# Patient Record
Sex: Female | Born: 1980 | Race: Black or African American | Hispanic: No | Marital: Single | Smoking: Never smoker
Health system: Southern US, Community
[De-identification: ages and names within clinical notes are randomized; demographics above are authoritative.]

## PROBLEM LIST (undated history)

## (undated) DIAGNOSIS — F419 Anxiety disorder, unspecified: Secondary | ICD-10-CM

## (undated) DIAGNOSIS — E119 Type 2 diabetes mellitus without complications: Secondary | ICD-10-CM

## (undated) DIAGNOSIS — D219 Benign neoplasm of connective and other soft tissue, unspecified: Secondary | ICD-10-CM

## (undated) DIAGNOSIS — U071 COVID-19: Secondary | ICD-10-CM

## (undated) DIAGNOSIS — Z973 Presence of spectacles and contact lenses: Secondary | ICD-10-CM

## (undated) DIAGNOSIS — I10 Essential (primary) hypertension: Secondary | ICD-10-CM

## (undated) HISTORY — PX: CHOLECYSTECTOMY: SHX55

---

## 2012-08-25 DIAGNOSIS — E119 Type 2 diabetes mellitus without complications: Secondary | ICD-10-CM | POA: Insufficient documentation

## 2013-04-01 HISTORY — PX: CHOLECYSTECTOMY: SHX55

## 2014-12-27 DIAGNOSIS — E1165 Type 2 diabetes mellitus with hyperglycemia: Secondary | ICD-10-CM | POA: Insufficient documentation

## 2015-06-12 ENCOUNTER — Ambulatory Visit
Admission: EM | Admit: 2015-06-12 | Discharge: 2015-06-12 | Disposition: A | Discharge status: Home / Self Care | Payer: Medicaid Other | Attending: Family Medicine | Admitting: Family Medicine

## 2015-06-12 DIAGNOSIS — H1012 Acute atopic conjunctivitis, left eye: Secondary | ICD-10-CM | POA: Diagnosis not present

## 2015-06-12 HISTORY — DX: Type 2 diabetes mellitus without complications: E11.9

## 2015-06-12 NOTE — ED Provider Notes (Signed)
CSN: [id number]     Arrival date & time 06/12/15  1801 History   First MD Initiated Contact with Patient 06/12/15 1847     Chief Complaint  Patient presents with  . Eye Drainage   (Consider location/radiation/quality/duration/timing/severity/associated sxs/prior Treatment) HPI Comments: 35 yo female with a 1 day h/o watery and itchy left eye. Denies any pain, purulent drainage, eye injury, swelling, fevers.   The history is provided by the patient.    Past Medical History  Diagnosis Date  . Diabetes mellitus without complication [facility])    Past Surgical History  Procedure Laterality Date  . Cholecystectomy     Family History  Problem Relation Age of Onset  . Diabetes Father    Social History  Substance Use Topics  . Smoking status: Never Smoker   . Smokeless tobacco: None  . Alcohol Use: No   OB History    No data available     Review of Systems  Allergies  Review of patient's allergies indicates no known allergies.  Home Medications   Prior to Admission medications   Medication Sig Start Date End Date Taking? Authorizing Provider  Insulin Aspart (NOVOLOG FLEXPEN [place]) Inject 20 Units into the skin.   Yes Historical Provider, MD  insulin glargine (LANTUS) 100 UNIT/ML injection Inject 50 Units into the skin at bedtime.   Yes Historical Provider, MD  metFORMIN (GLUCOPHAGE) 500 MG tablet Take 500 mg by mouth 2 (two) times daily with a meal.   Yes Historical Provider, MD   Meds Ordered and Administered this Visit  Medications - No data to display  BP 144/94 mmHg  Pulse 100  Temp(Src) 97.2 F (36.2 C) (Tympanic)  Resp 16  Ht 5\' 8"  (1.727 m)  Wt 260 lb (117.935 kg)  BMI 39.54 kg/m2  SpO2 100%  LMP 06/09/2015 No data found.   Physical Exam  Constitutional: She appears well-developed and well-nourished. No distress.  Eyes: EOM and lids are normal. Pupils are equal, round, and reactive to light. Right eye exhibits no discharge. Left eye exhibits no discharge. Left  conjunctiva is injected (mildly; watering). No scleral icterus.  Skin: She is not diaphoretic.  Nursing note and vitals reviewed.   ED Course  Procedures (including critical care time)  Labs Review Labs Reviewed - No data to display  Imaging Review No results found.   Visual Acuity Review  Right Eye Distance: 20/20 corrected Left Eye Distance: 20/40 corrected Bilateral Distance:    Right Eye Near:   Left Eye Near:    Bilateral Near:         MDM   1. Allergic conjunctivitis, left    1. diagnosis reviewed with patient 2. Recommend supportive treatment with otc antihistamine eye drops and oral med 3. Follow-up prn if symptoms worsen or don't improve    Norval Gable, MD 06/12/15 1920

## 2015-06-12 NOTE — ED Notes (Signed)
Started last night with left eye watering and itching. This morning left eye crusted shut

## 2015-07-05 ENCOUNTER — Encounter: Payer: Self-pay | Admitting: Gynecology

## 2015-07-05 ENCOUNTER — Ambulatory Visit
Admission: EM | Admit: 2015-07-05 | Discharge: 2015-07-05 | Disposition: A | Discharge status: Home / Self Care | Payer: Medicaid Other | Attending: Family Medicine | Admitting: Family Medicine

## 2015-07-05 DIAGNOSIS — R5383 Other fatigue: Secondary | ICD-10-CM | POA: Diagnosis not present

## 2015-07-05 DIAGNOSIS — R52 Pain, unspecified: Secondary | ICD-10-CM | POA: Insufficient documentation

## 2015-07-05 DIAGNOSIS — R509 Fever, unspecified: Secondary | ICD-10-CM | POA: Insufficient documentation

## 2015-07-05 DIAGNOSIS — I10 Essential (primary) hypertension: Secondary | ICD-10-CM | POA: Diagnosis not present

## 2015-07-05 DIAGNOSIS — J069 Acute upper respiratory infection, unspecified: Secondary | ICD-10-CM | POA: Insufficient documentation

## 2015-07-05 DIAGNOSIS — Z9049 Acquired absence of other specified parts of digestive tract: Secondary | ICD-10-CM | POA: Diagnosis not present

## 2015-07-05 DIAGNOSIS — E119 Type 2 diabetes mellitus without complications: Secondary | ICD-10-CM | POA: Insufficient documentation

## 2015-07-05 DIAGNOSIS — Z79899 Other long term (current) drug therapy: Secondary | ICD-10-CM | POA: Diagnosis not present

## 2015-07-05 DIAGNOSIS — Z794 Long term (current) use of insulin: Secondary | ICD-10-CM | POA: Insufficient documentation

## 2015-07-05 HISTORY — DX: Essential (primary) hypertension: I10

## 2015-07-05 LAB — RAPID INFLUENZA A&B ANTIGENS
Influenza A (ARMC): NEGATIVE
Influenza B (ARMC): NEGATIVE

## 2015-07-05 NOTE — ED Notes (Signed)
Patient c/o body aches / tired x yesterday.

## 2015-07-05 NOTE — ED Provider Notes (Signed)
CSN: [id number]     Arrival date & time 07/05/15  1957 History   First MD Initiated Contact with Patient 07/05/15 2106     Chief Complaint  Patient presents with  . Generalized Body Aches   (Consider location/radiation/quality/duration/timing/severity/associated sxs/prior Treatment) Patient is a 35 y.o. female presenting with URI. The history is provided by the patient.  URI Presenting symptoms: congestion, fatigue and fever   Severity:  Moderate Onset quality:  Sudden Duration:  2 days Timing:  Constant Progression:  Worsening Chronicity:  New Relieved by:  None tried Worsened by:  Nothing tried Ineffective treatments:  None tried Associated symptoms: no headaches, no sinus pain and no wheezing   Risk factors: sick contacts   Risk factors: not elderly, no chronic cardiac disease, no chronic kidney disease, no chronic respiratory disease, no diabetes mellitus, no immunosuppression, no recent illness and no recent travel     Past Medical History  Diagnosis Date  . Diabetes mellitus without complication ([place])   . Hypertension    Past Surgical History  Procedure Laterality Date  . Cholecystectomy     Family History  Problem Relation Age of Onset  . Diabetes Father    Social History  Substance Use Topics  . Smoking status: Never Smoker   . Smokeless tobacco: None  . Alcohol Use: No   OB History    No data available     Review of Systems  Constitutional: Positive for fever and fatigue.  HENT: Positive for congestion.   Respiratory: Negative for wheezing.   Neurological: Negative for headaches.    Allergies  Review of patient's allergies indicates no known allergies.  Home Medications   Prior to Admission medications   Medication Sig Start Date End Date Taking? Authorizing Provider  ferrous sulfate 325 (65 FE) MG tablet Take 325 mg by mouth daily with breakfast.   Yes Historical Provider, MD  hydrochlorothiazide (HYDRODIURIL) 25 MG tablet Take 25 mg by mouth  daily.   Yes Historical Provider, MD  Insulin Aspart (NOVOLOG FLEXPEN ) Inject 20 Units into the skin.   Yes Historical Provider, MD  insulin glargine (LANTUS) 100 UNIT/ML injection Inject 50 Units into the skin at bedtime.   Yes Historical Provider, MD  metFORMIN (GLUCOPHAGE) 500 MG tablet Take 500 mg by mouth 2 (two) times daily with a meal.   Yes Historical Provider, MD   Meds Ordered and Administered this Visit  Medications - No data to display  BP 132/75 mmHg  Pulse 103  Temp(Src) 97.6 F (36.4 C) (Oral)  Resp 18  Ht 5\' 8"  (1.727 m)  Wt 265 lb (120.203 kg)  BMI 40.30 kg/m2  SpO2 100%  LMP 06/09/2015 No data found.   Physical Exam  Constitutional: She appears well-developed and well-nourished. No distress.  HENT:  Head: Normocephalic and atraumatic.  Right Ear: Tympanic membrane, external ear and ear canal normal.  Left Ear: Tympanic membrane, external ear and ear canal normal.  Nose: Rhinorrhea present. No nose lacerations, sinus tenderness, nasal deformity, septal deviation or nasal septal hematoma. No epistaxis.  No foreign bodies.  Mouth/Throat: Uvula is midline, oropharynx is clear and moist and mucous membranes are normal. No oropharyngeal exudate.  Eyes: Conjunctivae and EOM are normal. Pupils are equal, round, and reactive to light. Right eye exhibits no discharge. Left eye exhibits no discharge. No scleral icterus.  Neck: Normal range of motion. Neck supple. No thyromegaly present.  Cardiovascular: Normal rate, regular rhythm and normal heart sounds.   Pulmonary/Chest: Effort normal  and breath sounds normal. No respiratory distress. She has no wheezes. She has no rales.  Lymphadenopathy:    She has no cervical adenopathy.  Skin: She is not diaphoretic.  Nursing note and vitals reviewed.   ED Course  Procedures (including critical care time)  Labs Review Labs Reviewed  RAPID INFLUENZA A&B ANTIGENS ([place])    Imaging Review No results  found.   Visual Acuity Review  Right Eye Distance:   Left Eye Distance:   Bilateral Distance:    Right Eye Near:   Left Eye Near:    Bilateral Near:         MDM   1. Viral URI    1. Lab results and diagnosis reviewed with patient 2. Recommend supportive treatment with otc analgesics prn, increased fluids 3. Follow-up prn if symptoms worsen or don't improve    Norval Gable, MD 07/05/15 2150

## 2015-08-06 ENCOUNTER — Ambulatory Visit
Admission: EM | Admit: 2015-08-06 | Discharge: 2015-08-06 | Disposition: A | Discharge status: Home / Self Care | Payer: Medicaid Other | Attending: Family Medicine | Admitting: Family Medicine

## 2015-08-06 DIAGNOSIS — B349 Viral infection, unspecified: Secondary | ICD-10-CM

## 2015-08-06 MED ORDER — FLUTICASONE PROPIONATE 50 MCG/ACT NA SUSP: 2.0000 | Freq: Every day | NASAL | Status: DC

## 2015-08-06 NOTE — ED Provider Notes (Signed)
CSN: [id number]     Arrival date & time 08/06/15  1450 History   First MD Initiated Contact with Patient 08/06/15 1615     Chief Complaint  Patient presents with  . Nasal Congestion    Runny nose, congestion, body aches starting on Friday. Pain 7/10   (Consider location/radiation/quality/duration/timing/severity/associated sxs/prior Treatment) HPI   So 35 year old female who presents with congestion runny nose which began days ago. Since she is so weak at times that that her husband has to help her up out of a chair or the bed. She has been coughing with some brown sputum but is not coughing much at nighttime. Her urine is very concentrated data set very dark yellow according to her. She has not been drinking a lot of fluid because it hurts to swallow. Review of her previous visits show that she was here on April and was tested for influenza which was negative.  Past Medical History  Diagnosis Date  . Diabetes mellitus without complication ([place])   . Hypertension    Past Surgical History  Procedure Laterality Date  . Cholecystectomy     Family History  Problem Relation Age of Onset  . Diabetes Father    Social History  Substance Use Topics  . Smoking status: Never Smoker   . Smokeless tobacco: None  . Alcohol Use: No   OB History    No data available     Review of Systems  Constitutional: Positive for activity change, appetite change and fatigue. Negative for chills.  HENT: Positive for postnasal drip, rhinorrhea, sinus pressure, sneezing and sore throat.   Respiratory: Positive for cough. Negative for shortness of breath.   All other systems reviewed and are negative.   Allergies  Review of patient's allergies indicates no known allergies.  Home Medications   Prior to Admission medications   Medication Sig Start Date End Date Taking? Authorizing Provider  ferrous sulfate 325 (65 FE) MG tablet Take 325 mg by mouth daily with breakfast.   Yes Historical Provider, MD   hydrochlorothiazide (HYDRODIURIL) 25 MG tablet Take 25 mg by mouth daily.   Yes Historical Provider, MD  Insulin Aspart (NOVOLOG FLEXPEN [place]) Inject 20 Units into the skin.   Yes Historical Provider, MD  insulin glargine (LANTUS) 100 UNIT/ML injection Inject 50 Units into the skin at bedtime.   Yes Historical Provider, MD  metFORMIN (GLUCOPHAGE) 500 MG tablet Take 500 mg by mouth 2 (two) times daily with a meal.   Yes Historical Provider, MD  fluticasone (FLONASE) 50 MCG/ACT nasal spray Place 2 sprays into both nostrils daily. 08/06/15   Lorin Picket, PA-C   Meds Ordered and Administered this Visit  Medications - No data to display  BP 133/90 mmHg  Pulse 98  Temp(Src) 98.7 F (37.1 C) (Oral)  Resp 20  Ht 5\' 8"  (1.727 m)  Wt 270 lb (122.471 kg)  BMI 41.06 kg/m2  SpO2 100%  LMP 07/31/2015 No data found.   Physical Exam  Constitutional: She is oriented to person, place, and time. She appears well-developed and well-nourished. No distress.  HENT:  Head: Normocephalic and atraumatic.  Right Ear: External ear normal.  Left Ear: External ear normal.  Nose: Nose normal.  Mouth/Throat: Oropharynx is clear and moist. No oropharyngeal exudate.  No tenderness to percussion over the sinuses  Eyes: Conjunctivae are normal. Pupils are equal, round, and reactive to light.  Neck: Normal range of motion. Neck supple.  Pulmonary/Chest: Effort normal and breath sounds normal. No  respiratory distress. She has no wheezes. She has no rales.  Musculoskeletal: Normal range of motion. She exhibits no edema or tenderness.  Lymphadenopathy:    She has no cervical adenopathy.  Neurological: She is alert and oriented to person, place, and time.  Skin: Skin is warm and dry. She is not diaphoretic.  Psychiatric: She has a normal mood and affect. Her behavior is normal. Judgment and thought content normal.  Nursing note and vitals reviewed.   ED Course  Procedures (including critical care time)  Labs  Review Labs Reviewed - No data to display  Imaging Review No results found.   Visual Acuity Review  Right Eye Distance:   Left Eye Distance:   Bilateral Distance:    Right Eye Near:   Left Eye Near:    Bilateral Near:         MDM   1. Acute viral syndrome    New Prescriptions   FLUTICASONE (FLONASE) 50 MCG/ACT NASAL SPRAY    Place 2 sprays into both nostrils daily.  Plan: 1. Test/x-ray results and diagnosis reviewed with patient 2. rx as per orders; risks, benefits, potential side effects reviewed with patient 3. Recommend supportive treatment with Tylenol or Motrin around-the-clock. Rest and increased fluids to have urine clear one fevers or is not improving or worsening she should be seen by her primary care physician. Given her excuse for school tomorrow but should return to classes on Tuesday. 4. F/u prn if symptoms worsen or don't improve     Lorin Picket, PA-C 08/06/15 1635

## 2015-08-18 ENCOUNTER — Ambulatory Visit
Admission: EM | Admit: 2015-08-18 | Discharge: 2015-08-18 | Disposition: A | Discharge status: Home / Self Care | Payer: Medicaid Other | Attending: Family Medicine | Admitting: Family Medicine

## 2015-08-18 ENCOUNTER — Encounter: Payer: Self-pay | Admitting: Emergency Medicine

## 2015-08-18 DIAGNOSIS — K0889 Other specified disorders of teeth and supporting structures: Secondary | ICD-10-CM | POA: Diagnosis not present

## 2015-08-18 MED ORDER — PENICILLIN V POTASSIUM 500 MG PO TABS: 500.0000 mg | ORAL_TABLET | Freq: Four times a day (QID) | ORAL | Status: DC

## 2015-08-18 MED ORDER — OXYCODONE-ACETAMINOPHEN 5-325 MG PO TABS: 1.0000 | ORAL_TABLET | Freq: Three times a day (TID) | ORAL | Status: DC | PRN

## 2015-08-18 NOTE — ED Provider Notes (Signed)
[facility]  Time seen: Approximately 3:20 PM  I have reviewed the triage vital signs and the nursing notes.   HISTORY  Chief Complaint Dental Pain    HPI Joan Schmidt is a 35 y.o. female presents for the complaints of right lower dental pain since yesterday. Reports history of broken tooth to that same area for a "long time" but states last night pain started to the tooth. States area feels swollen. Denies drainage. Denies fevers. Denies trauma. Reports continues to eat and drink well. Denies other pain. Denies pain radiation. Denies recent sickness. States she knows she has been needing to see her dentist but has been putting it off. States she will now follow up, but her dentist was closed today.   PCP: [facility]   Patient's last menstrual period was 08/01/2015 (approximate).Denies chance of pregnancy.    Past Medical History  Diagnosis Date  . Diabetes mellitus without complication ([place])   . Hypertension   blood sugar this am 140 per patient  There are no active problems to display for this patient.   Past Surgical History  Procedure Laterality Date  . Cholecystectomy      Current Outpatient Rx  Name  Route  Sig  Dispense  Refill  . ferrous sulfate 325 (65 FE) MG tablet   Oral   Take 325 mg by mouth daily with breakfast.         . fluticasone (FLONASE) 50 MCG/ACT nasal spray   Each Nare   Place 2 sprays into both nostrils daily.   16 g   0   . hydrochlorothiazide (HYDRODIURIL) 25 MG tablet   Oral   Take 25 mg by mouth daily.         . Insulin Aspart (NOVOLOG FLEXPEN [place])   Subcutaneous   Inject 20 Units into the skin.         Marland Kitchen insulin glargine (LANTUS) 100 UNIT/ML injection   Subcutaneous   Inject 50 Units into the skin at bedtime.         . metFORMIN (GLUCOPHAGE) 500 MG tablet   Oral   Take 500 mg by mouth 2 (two) times daily with a meal.           Allergies Review of patient's allergies indicates no known  allergies.  Family History  Problem Relation Age of Onset  . Diabetes Father     Social History Social History  Substance Use Topics  . Smoking status: Never Smoker   . Smokeless tobacco: None  . Alcohol Use: No    Review of Systems Constitutional: No fever/chills. Reports continues to eat and drink foods and fluids well.  Eyes: No visual changes. ENT: No sore throat. Cardiovascular: Denies chest pain. Respiratory: Denies shortness of breath. Gastrointestinal: No abdominal pain.  No nausea, no vomiting.  Genitourinary: Negative for dysuria. Musculoskeletal: Negative for back pain. Skin: Negative for rash. Neurological: Negative for headaches, focal weakness or numbness. 10-point ROS otherwise negative.  ____________________________________________   PHYSICAL EXAM:  VITAL SIGNS: ED Triage Vitals  Enc Vitals Group     BP 08/18/15 1434 151/74 mmHg     Pulse Rate 08/18/15 1434 91     Resp 08/18/15 1434 16     Temp 08/18/15 1434 98 F (36.7 C)     Temp Source 08/18/15 1434 Oral     SpO2 08/18/15 1434 100 %     Weight 08/18/15 1434 270 lb (122.471 kg)     Height 08/18/15 1434 5\' 8"  (  1.727 m)     Head Cir --      Peak Flow --      Pain Score 08/18/15 1436 5     Pain Loc --      Pain Edu? --      Excl. in [place]? --     Constitutional: Alert and oriented. Well appearing and in no acute distress. Eyes: Conjunctivae are normal. PERRL. EOMI. Head: Atraumatic. Ears: Bilateral ears no erythema, normal TMs.  Nose: No congestion/rhinnorhea. Mouth/Throat: Mucous membranes are moist.  Oropharynx non-erythematous. Periodontal Exam   Fracture to tooth #30 as above. Mild gum erythema directly around tooth #30 and 29. Widespread dental decay , no palpable or visualized abscess.  Neck: No stridor.  Hematological/Lymphatic/Immunilogical: No cervical lymphadenopathy. Cardiovascular:   Normal rate, regular rhythm. Grossly normal heart sounds. Good peripheral  circulation. Respiratory: Normal respiratory effort.  No retractions. Musculoskeletal: No lower or upper extremity tenderness nor edema.  No joint effusions. Neurologic:  Normal speech and language. No gross focal neurologic deficits are appreciated. Speech is normal. No gait instability. Skin:  Skin is warm, dry and intact. No rash noted. Psychiatric: Mood and affect are normal. Speech and behavior are normal.  ____________________________________________   LABS (all labs ordered are listed, but only abnormal results are displayed)  Labs Reviewed - No data to display ____________________________________________ _________________________________________   INITIAL IMPRESSION / ASSESSMENT AND PLAN / ED COURSE  Pertinent labs & imaging results that were available during my care of the patient were reviewed by me and considered in my medical decision making (see chart for details).  Well-appearing no acute distress. Presents with complaints of right lower dental pain 1 day. History of mini dental cavities, dental fractures as well as uncontrolled diabetes. Reports follows with her primary care and monitoring her diabetes as prescribed. No palpable or visualized dental abscess. Will treat right lower dental pain and suspected infection with oral Pen-Vee K and when necessary Percocet. Encouraged patient to follow-up closely with her dentist and PCP.  Patient was advised to see the dentist within 10 days. Also advised to take the antibiotic until finished. Instructed to return to the urgent care or ER for symptoms that change or worsen or if unable to schedule an appointment. ____________________________________________   FINAL CLINICAL IMPRESSION(S) / ED DIAGNOSES  Final diagnoses:  Pain, dental       Marylene Land, NP 08/18/15 1549  Marylene Land, NP 08/18/15 1551

## 2015-08-18 NOTE — ED Notes (Signed)
Patient states that she broke her tooth on left bottom side since yesterday.

## 2015-08-18 NOTE — Discharge Instructions (Signed)
Take medication as prescribed. Rest. Drink plenty of fluids. Eat soft food diet. Follow up with your dentist as soon as possible.   Follow up with your primary care physician this week as needed. Return to Urgent care for new or worsening concerns.

## 2015-11-17 ENCOUNTER — Encounter: Payer: Self-pay | Admitting: Emergency Medicine

## 2015-11-17 ENCOUNTER — Emergency Department: Payer: Medicaid Other

## 2015-11-17 ENCOUNTER — Emergency Department
Admission: EM | Admit: 2015-11-17 | Discharge: 2015-11-17 | Disposition: A | Discharge status: Home / Self Care | Payer: Medicaid Other | Attending: Emergency Medicine | Admitting: Emergency Medicine

## 2015-11-17 DIAGNOSIS — I10 Essential (primary) hypertension: Secondary | ICD-10-CM | POA: Diagnosis not present

## 2015-11-17 DIAGNOSIS — D259 Leiomyoma of uterus, unspecified: Secondary | ICD-10-CM | POA: Diagnosis not present

## 2015-11-17 DIAGNOSIS — R1031 Right lower quadrant pain: Secondary | ICD-10-CM | POA: Diagnosis present

## 2015-11-17 DIAGNOSIS — Z792 Long term (current) use of antibiotics: Secondary | ICD-10-CM | POA: Diagnosis not present

## 2015-11-17 DIAGNOSIS — E119 Type 2 diabetes mellitus without complications: Secondary | ICD-10-CM | POA: Diagnosis not present

## 2015-11-17 DIAGNOSIS — Z794 Long term (current) use of insulin: Secondary | ICD-10-CM | POA: Insufficient documentation

## 2015-11-17 DIAGNOSIS — Z7984 Long term (current) use of oral hypoglycemic drugs: Secondary | ICD-10-CM | POA: Diagnosis not present

## 2015-11-17 DIAGNOSIS — Z7951 Long term (current) use of inhaled steroids: Secondary | ICD-10-CM | POA: Diagnosis not present

## 2015-11-17 LAB — URINALYSIS COMPLETE WITH MICROSCOPIC (ARMC ONLY)
Bilirubin Urine: NEGATIVE
Glucose, UA: NEGATIVE mg/dL
Leukocytes, UA: NEGATIVE
Nitrite: NEGATIVE
PROTEIN: NEGATIVE mg/dL
Specific Gravity, Urine: 1.012 (ref 1.005–1.030)
pH: 6 (ref 5.0–8.0)

## 2015-11-17 LAB — CBC
HCT: 38.1 % (ref 35.0–47.0)
HEMOGLOBIN: 11.9 g/dL — AB (ref 12.0–16.0)
MCH: 20.5 pg — AB (ref 26.0–34.0)
MCHC: 31.2 g/dL — ABNORMAL LOW (ref 32.0–36.0)
MCV: 65.6 fL — AB (ref 80.0–100.0)
Platelets: 214 10*3/uL (ref 150–440)
RBC: 5.8 MIL/uL — AB (ref 3.80–5.20)
RDW: 18.9 % — ABNORMAL HIGH (ref 11.5–14.5)
WBC: 6.3 10*3/uL (ref 3.6–11.0)

## 2015-11-17 LAB — COMPREHENSIVE METABOLIC PANEL
ALBUMIN: 4.1 g/dL (ref 3.5–5.0)
ALT: 45 U/L (ref 14–54)
ANION GAP: 6 (ref 5–15)
AST: 38 U/L (ref 15–41)
Alkaline Phosphatase: 53 U/L (ref 38–126)
BUN: 9 mg/dL (ref 6–20)
CHLORIDE: 108 mmol/L (ref 101–111)
CO2: 25 mmol/L (ref 22–32)
Calcium: 8.9 mg/dL (ref 8.9–10.3)
Creatinine, Ser: 0.68 mg/dL (ref 0.44–1.00)
GFR calc non Af Amer: >60 mL/min (ref >60)
GLUCOSE: 104 mg/dL — AB (ref 65–99)
Potassium: 4.2 mmol/L (ref 3.5–5.1)
SODIUM: 139 mmol/L (ref 135–145)
Total Bilirubin: 0.4 mg/dL (ref 0.3–1.2)
Total Protein: 7.7 g/dL (ref 6.5–8.1)

## 2015-11-17 LAB — POCT PREGNANCY, URINE: PREG TEST UR: NEGATIVE

## 2015-11-17 LAB — LIPASE, BLOOD: LIPASE: 16 U/L (ref 11–51)

## 2015-11-17 MED ORDER — OXYCODONE-ACETAMINOPHEN 5-325 MG PO TABS: 1.0000 | ORAL_TABLET | ORAL | 0 refills | Status: DC | PRN

## 2015-11-17 MED ORDER — OXYCODONE-ACETAMINOPHEN 5-325 MG PO TABS
2.0000 | ORAL_TABLET | Freq: Once | ORAL | Status: AC
  Administered 2015-11-17: 2 via ORAL
  Filled 2015-11-17: qty 2

## 2015-11-17 MED ORDER — ONDANSETRON 4 MG PO TBDP: ORAL_TABLET | ORAL | 0 refills | Status: DC

## 2015-11-17 MED ORDER — DOCUSATE SODIUM 100 MG PO CAPS: ORAL_CAPSULE | ORAL | 0 refills | Status: AC

## 2015-11-17 NOTE — ED Provider Notes (Signed)
[facility] Emergency Department Provider Note  ____________________________________________   First MD Initiated Contact with Patient 11/17/15 1607     (approximate)  I have reviewed the triage vital signs and the nursing notes.   HISTORY  Chief Complaint Abdominal Pain    HPI Joan Schmidt is a 35 y.o. female whose medical history includes fibroids and regular menses who presents for evaluation of gradual onset right lower quadrant pain starting this morning.  She states that she awoke and had the pain in her right lower quadrant.  It is mild to moderate but she became concerned because her husband told her it may be appendicitis.  It improved with Tylenol but did not completely go away.  It is currently mild.  Moving around makes it worse.  She denies fever/chills, chest pain, shortness of breath, nausea, vomiting, diarrhea, constipation, dysuria.  Her last menstrual cycle was 3 weeks ago and she has started spotting.  She has never had problems with ovarian cysts before but she has had bleeding issues and pain that she believes to be associated with her fibroid.  He sees a GYN doctor at [facility] for this issue.  She denies any recent vaginal discharge.  Past Medical History:  Diagnosis Date  . Diabetes mellitus without complication ([place])   . Hypertension     There are no active problems to display for this patient.   Past Surgical History:  Procedure Laterality Date  . CHOLECYSTECTOMY      Prior to Admission medications   Medication Sig Start Date End Date Taking? Authorizing Provider  docusate sodium (COLACE) 100 MG capsule Take 1 tablet once or twice daily as needed for constipation while taking narcotic pain medicine 11/17/15   Hinda Kehr, MD  ferrous sulfate 325 (65 FE) MG tablet Take 325 mg by mouth daily with breakfast.    Historical Provider, MD  fluticasone (FLONASE) 50 MCG/ACT nasal spray Place 2 sprays into both nostrils daily. 08/06/15    Lorin Picket, PA-C  hydrochlorothiazide (HYDRODIURIL) 25 MG tablet Take 25 mg by mouth daily.    Historical Provider, MD  Insulin Aspart (NOVOLOG FLEXPEN [place]) Inject 20 Units into the skin.    Historical Provider, MD  insulin glargine (LANTUS) 100 UNIT/ML injection Inject 50 Units into the skin at bedtime.    Historical Provider, MD  metFORMIN (GLUCOPHAGE) 500 MG tablet Take 500 mg by mouth 2 (two) times daily with a meal.    Historical Provider, MD  ondansetron (ZOFRAN ODT) 4 MG disintegrating tablet Allow 1-2 tablets to dissolve in your mouth every 8 hours as needed for nausea/vomiting 11/17/15   Hinda Kehr, MD  oxyCODONE-acetaminophen (ROXICET) 5-325 MG tablet Take 1-2 tablets by mouth every 4 (four) hours as needed for severe pain. 11/17/15   Hinda Kehr, MD  penicillin v potassium (VEETID) 500 MG tablet Take 1 tablet (500 mg total) by mouth 4 (four) times daily. 08/18/15   Marylene Land, NP    Allergies Review of patient's allergies indicates no known allergies.  Family History  Problem Relation Age of Onset  . Diabetes Father     Social History Social History  Substance Use Topics  . Smoking status: Never Smoker  . Smokeless tobacco: Never Used  . Alcohol use No    Review of Systems Constitutional: No fever/chills Eyes: No visual changes. ENT: No sore throat. Cardiovascular: Denies chest pain. Respiratory: Denies shortness of breath. Gastrointestinal: +abdominal pain.  No nausea, no vomiting.  No diarrhea.  No  constipation. Genitourinary: Negative for dysuria. Recent vaginal spotting Musculoskeletal: Negative for back pain. Skin: Negative for rash. Neurological: Negative for headaches, focal weakness or numbness.  10-point ROS otherwise negative.  ____________________________________________   PHYSICAL EXAM:  VITAL SIGNS: ED Triage Vitals [11/17/15 1422]  Enc Vitals Group     BP 129/76     Pulse Rate 86     Resp 20     Temp 98.5 F (36.9 C)     Temp  Source Oral     SpO2 99 %     Weight 270 lb (122.5 kg)     Height 5\' 8"  (1.727 m)     Head Circumference      Peak Flow      Pain Score 8     Pain Loc      Pain Edu?      Excl. in [place]?     Constitutional: Alert and oriented. Well appearing and in no acute distress. Eyes: Conjunctivae are normal. PERRL. EOMI. Head: Atraumatic. Nose: No congestion/rhinnorhea. Mouth/Throat: Mucous membranes are moist.  Oropharynx non-erythematous. Neck: No stridor.  No meningeal signs.   Cardiovascular: Normal rate, regular rhythm. Good peripheral circulation. Grossly normal heart sounds.  Respiratory: Normal respiratory effort.  No retractions. Lungs CTAB. Gastrointestinal: Obese.  Soft with mild tenderness to palpation of her suprapubic region and right lower quadrant.  No rebound and no guarding. Genitourinary: Deferred at patient's preference Musculoskeletal: No lower extremity tenderness nor edema. No gross deformities of extremities. Neurologic:  Normal speech and language. No gross focal neurologic deficits are appreciated.  Skin:  Skin is warm, dry and intact. No rash noted. Psychiatric: Mood and affect are normal. Speech and behavior are normal.  ____________________________________________   LABS (all labs ordered are listed, but only abnormal results are displayed)  Labs Reviewed  COMPREHENSIVE METABOLIC PANEL - Abnormal; Notable for the following:       Result Value   Glucose, Bld 104 (*)    All other components within normal limits  CBC - Abnormal; Notable for the following:    RBC 5.80 (*)    Hemoglobin 11.9 (*)    MCV 65.6 (*)    MCH 20.5 (*)    MCHC 31.2 (*)    RDW 18.9 (*)    All other components within normal limits  URINALYSIS COMPLETEWITH MICROSCOPIC ([facility] ONLY) - Abnormal; Notable for the following:    Color, Urine YELLOW (*)    APPearance CLEAR (*)    Ketones, ur 1+ (*)    Hgb urine dipstick 3+ (*)    Bacteria, UA RARE (*)    Squamous Epithelial / LPF 0-5 (*)     All other components within normal limits  LIPASE, BLOOD  POC URINE PREG, ED  POCT PREGNANCY, URINE   ____________________________________________  EKG  None ____________________________________________  RADIOLOGY   US Transvaginal Non-ob  Result Date: 11/17/2015 CLINICAL DATA:  Right lower quadrant/pelvic pain, known uterine fibroids EXAM: TRANSABDOMINAL AND TRANSVAGINAL ULTRASOUND OF PELVIS TECHNIQUE: Both transabdominal and transvaginal ultrasound examinations of the pelvis were performed. Transabdominal technique was performed for global imaging of the pelvis including uterus, ovaries, adnexal regions, and pelvic cul-de-sac. It was necessary to proceed with endovaginal exam following the transabdominal exam to visualize the endometrium. COMPARISON:  None FINDINGS: Uterus Measurements: 15.1 x 9.4 x 11.3 cm. Multiple large uterine fibroids. Dominant 8.7 x 8.7 x 9.1 cm intramural fibroid in the left uterine body. Dominant 6.2 x 5.4 x 5.5 cm intramural fibroid in the left anterior  uterine fundus. Endometrium Thickness: 9 mm.  No focal abnormality visualized. Right ovary Measurements: 3.4 x 2.9 x 3.3 cm. Normal appearance/no adnexal mass. Left ovary Measurements: 3.5 x 1.9 x 3.3 cm. Normal appearance/no adnexal mass. Other findings No abnormal free fluid. IMPRESSION: Enlarged uterus with two dominant uterine fibroids measuring up to 9.1 cm in the left uterine body. Endometrial complex measures 9 mm, within normal limits. Bilateral ovaries are unremarkable. Electronically Signed   By: Julian Hy M.D.   On: 11/17/2015 17:29   US Pelvis Complete  Result Date: 11/17/2015 CLINICAL DATA:  Right lower quadrant/pelvic pain, known uterine fibroids EXAM: TRANSABDOMINAL AND TRANSVAGINAL ULTRASOUND OF PELVIS TECHNIQUE: Both transabdominal and transvaginal ultrasound examinations of the pelvis were performed. Transabdominal technique was performed for global imaging of the pelvis including uterus,  ovaries, adnexal regions, and pelvic cul-de-sac. It was necessary to proceed with endovaginal exam following the transabdominal exam to visualize the endometrium. COMPARISON:  None FINDINGS: Uterus Measurements: 15.1 x 9.4 x 11.3 cm. Multiple large uterine fibroids. Dominant 8.7 x 8.7 x 9.1 cm intramural fibroid in the left uterine body. Dominant 6.2 x 5.4 x 5.5 cm intramural fibroid in the left anterior uterine fundus. Endometrium Thickness: 9 mm.  No focal abnormality visualized. Right ovary Measurements: 3.4 x 2.9 x 3.3 cm. Normal appearance/no adnexal mass. Left ovary Measurements: 3.5 x 1.9 x 3.3 cm. Normal appearance/no adnexal mass. Other findings No abnormal free fluid. IMPRESSION: Enlarged uterus with two dominant uterine fibroids measuring up to 9.1 cm in the left uterine body. Endometrial complex measures 9 mm, within normal limits. Bilateral ovaries are unremarkable. Electronically Signed   By: Julian Hy M.D.   On: 11/17/2015 17:29    ____________________________________________   PROCEDURES  Procedure(s) performed:   Procedures   Critical Care performed: No ____________________________________________   INITIAL IMPRESSION / ASSESSMENT AND PLAN / ED COURSE  Pertinent labs & imaging results that were available during my care of the patient were reviewed by me and considered in my medical decision making (see chart for details).  Patient's physical exam is reassuring as is her lab workup.  I find early appendicitis to be possible but very low probability and I explained this to the patient.  I think that pain associated with either ovarian cyst or her fibroids is much more likely.  We discussed it including the long-term risks and benefits of CT scan, and we agreed that we would proceed with ultrasound instead.  She also prefers not to have a pelvic exam at this time which I think is appropriate given that it is very unlikely to find a diagnosis based on the pelvic  exam.   Clinical Course  Comment By Time  Patient feels no worse, still with some mild pain worse with movement.  Her ultrasound was reassuring.  I still feel that a CT scan is not indicated and she understands and agrees.  She will follow up with her OB/GYN at the next available opportunity and in fact has already scheduled an appointment.I gave my usual and customary return precautions.  Hinda Kehr, MD 08/18 1839    ____________________________________________  FINAL CLINICAL IMPRESSION(S) / ED DIAGNOSES  Final diagnoses:  Right lower quadrant abdominal pain  Uterine leiomyoma, unspecified location     MEDICATIONS GIVEN DURING THIS VISIT:  Medications  oxyCODONE-acetaminophen (PERCOCET/ROXICET) 5-325 MG per tablet 2 tablet (not administered)     NEW OUTPATIENT MEDICATIONS STARTED DURING THIS VISIT:  New Prescriptions   DOCUSATE SODIUM (COLACE) 100 MG CAPSULE  Take 1 tablet once or twice daily as needed for constipation while taking narcotic pain medicine   ONDANSETRON (ZOFRAN ODT) 4 MG DISINTEGRATING TABLET    Allow 1-2 tablets to dissolve in your mouth every 8 hours as needed for nausea/vomiting   OXYCODONE-ACETAMINOPHEN (ROXICET) 5-325 MG TABLET    Take 1-2 tablets by mouth every 4 (four) hours as needed for severe pain.      Note:  This document was prepared using Dragon voice recognition software and may include unintentional dictation errors.    Hinda Kehr, MD 11/17/15 [phone]

## 2015-11-17 NOTE — Discharge Instructions (Signed)
You have been seen in the Emergency Department (ED) for abdominal pain.  Your evaluation did not identify a clear cause of your symptoms but was generally reassuring.  The ultrasound did show that you have two dominant uterine fibroids measuring up to 9.1 cm in the left uterine body which may be contributing to your symptoms.  Please follow up as instructed above regarding today?s emergent visit and the symptoms that are bothering you.  Return to the ED if your abdominal pain worsens or fails to improve, you develop bloody vomiting, bloody diarrhea, you are unable to tolerate fluids due to vomiting, fever greater than 101, or other symptoms that concern you.

## 2015-11-17 NOTE — ED Triage Notes (Signed)
Pt to ed with c/o right lower quad abd pain that started this am.  Pt reports pain is better after taking tylenol but she wanted to get checked out "since it could be my appendix"  Pt appears in no acute distress t this time. Denies n/v/d/.

## 2015-11-17 NOTE — ED Notes (Addendum)
Pain in abdomen that started yesterday worse this morning from right lower abdomen radiating to the back. Pt c/o pain 8/10. Denies any diarrhea or vomiting. C/o dizziness at times. Pt took tylenol at 0600.

## 2015-11-20 ENCOUNTER — Encounter: Payer: Self-pay | Admitting: Emergency Medicine

## 2015-11-20 ENCOUNTER — Emergency Department
Admission: EM | Admit: 2015-11-20 | Discharge: 2015-11-20 | Disposition: A | Discharge status: Home / Self Care | Payer: Medicaid Other | Attending: Emergency Medicine | Admitting: Emergency Medicine

## 2015-11-20 DIAGNOSIS — Z794 Long term (current) use of insulin: Secondary | ICD-10-CM | POA: Diagnosis not present

## 2015-11-20 DIAGNOSIS — E119 Type 2 diabetes mellitus without complications: Secondary | ICD-10-CM | POA: Diagnosis not present

## 2015-11-20 DIAGNOSIS — I10 Essential (primary) hypertension: Secondary | ICD-10-CM | POA: Insufficient documentation

## 2015-11-20 DIAGNOSIS — Z7951 Long term (current) use of inhaled steroids: Secondary | ICD-10-CM | POA: Insufficient documentation

## 2015-11-20 DIAGNOSIS — N939 Abnormal uterine and vaginal bleeding, unspecified: Secondary | ICD-10-CM | POA: Diagnosis not present

## 2015-11-20 HISTORY — DX: Benign neoplasm of connective and other soft tissue, unspecified: D21.9

## 2015-11-20 LAB — CBC WITH DIFFERENTIAL/PLATELET
BASOS ABS: 0 10*3/uL (ref 0–0.1)
BASOS PCT: 0 %
Eosinophils Absolute: 0.2 10*3/uL (ref 0–0.7)
Eosinophils Relative: 3 %
HEMATOCRIT: 34.3 % — AB (ref 35.0–47.0)
HEMOGLOBIN: 10.9 g/dL — AB (ref 12.0–16.0)
Lymphocytes Relative: 43 %
Lymphs Abs: 2.5 10*3/uL (ref 1.0–3.6)
MCH: 20.8 pg — ABNORMAL LOW (ref 26.0–34.0)
MCHC: 31.9 g/dL — ABNORMAL LOW (ref 32.0–36.0)
MCV: 65.2 fL — ABNORMAL LOW (ref 80.0–100.0)
MONO ABS: 0.5 10*3/uL (ref 0.2–0.9)
Monocytes Relative: 10 %
NEUTROS ABS: 2.5 10*3/uL (ref 1.4–6.5)
NEUTROS PCT: 44 %
Platelets: 229 10*3/uL (ref 150–440)
RBC: 5.26 MIL/uL — ABNORMAL HIGH (ref 3.80–5.20)
RDW: 19.4 % — AB (ref 11.5–14.5)
WBC: 5.8 10*3/uL (ref 3.6–11.0)

## 2015-11-20 LAB — CBC
HCT: 36.3 % (ref 35.0–47.0)
HEMOGLOBIN: 11.4 g/dL — AB (ref 12.0–16.0)
MCH: 20.6 pg — ABNORMAL LOW (ref 26.0–34.0)
MCHC: 31.4 g/dL — AB (ref 32.0–36.0)
MCV: 65.5 fL — ABNORMAL LOW (ref 80.0–100.0)
PLATELETS: 233 10*3/uL (ref 150–440)
RBC: 5.54 MIL/uL — ABNORMAL HIGH (ref 3.80–5.20)
RDW: 18.9 % — AB (ref 11.5–14.5)
WBC: 6.4 10*3/uL (ref 3.6–11.0)

## 2015-11-20 LAB — BASIC METABOLIC PANEL
Anion gap: 4 — ABNORMAL LOW (ref 5–15)
BUN: 11 mg/dL (ref 6–20)
CALCIUM: 8.9 mg/dL (ref 8.9–10.3)
CO2: 27 mmol/L (ref 22–32)
Chloride: 106 mmol/L (ref 101–111)
Creatinine, Ser: 0.61 mg/dL (ref 0.44–1.00)
GFR calc non Af Amer: >60 mL/min (ref >60)
Glucose, Bld: 84 mg/dL (ref 65–99)
Potassium: 3.8 mmol/L (ref 3.5–5.1)
SODIUM: 137 mmol/L (ref 135–145)

## 2015-11-20 LAB — TYPE AND SCREEN
ABO/RH(D): AB POS
ANTIBODY SCREEN: NEGATIVE

## 2015-11-20 NOTE — ED Triage Notes (Signed)
Seen through ED on Friday for c/o abdominal pain.  States began vaginal bleeding Friday evening, and bleeding has continued over the weekend.  Spoke with OBGYN who referred patient to ED for evaluation.  Patient states she has history of uterine fibroids.  States using pads and currently changing pad every 2 hours, but last night patient was changing pads every 30 minutes.

## 2015-11-20 NOTE — ED Provider Notes (Signed)
[facility] Emergency Department Provider Note   ____________________________________________   I have reviewed the triage vital signs and the nursing notes.   HISTORY  Chief Complaint Vaginal Bleeding   History limited by: Not Limited   HPI Joan Schmidt is a 35 y.o. female with history of fibroids who presents to the emergency department because of concern for vaginal bleeding. The patient in the emergency department on Friday because of concerns for abdominal pain. At that time she did not have any vaginal bleeding. An ultrasound was done which did show fibroids. She states that after leaving the emergency department she had vaginal bleeding. She states that it has been heavy. Last night she was changing pads every 30 minutes. It has slowed some today. He has had a history of similar in the past and states that she was given injections by her OB/GYN doctor to shrink the fibroids in the past. She denies any fevers, chest pains or shortness of breath.   Past Medical History:  Diagnosis Date  . Diabetes mellitus without complication ([place])   . Fibroids    uterine  . Hypertension     There are no active problems to display for this patient.   Past Surgical History:  Procedure Laterality Date  . CHOLECYSTECTOMY      Prior to Admission medications   Medication Sig Start Date End Date Taking? Authorizing Provider  docusate sodium (COLACE) 100 MG capsule Take 1 tablet once or twice daily as needed for constipation while taking narcotic pain medicine 11/17/15   Hinda Kehr, MD  ferrous sulfate 325 (65 FE) MG tablet Take 325 mg by mouth daily with breakfast.    Historical Provider, MD  fluticasone (FLONASE) 50 MCG/ACT nasal spray Place 2 sprays into both nostrils daily. 08/06/15   Lorin Picket, PA-C  hydrochlorothiazide (HYDRODIURIL) 25 MG tablet Take 25 mg by mouth daily.    Historical Provider, MD  Insulin Aspart (NOVOLOG FLEXPEN [place]) Inject 20 Units into  the skin.    Historical Provider, MD  insulin glargine (LANTUS) 100 UNIT/ML injection Inject 50 Units into the skin at bedtime.    Historical Provider, MD  metFORMIN (GLUCOPHAGE) 500 MG tablet Take 500 mg by mouth 2 (two) times daily with a meal.    Historical Provider, MD  ondansetron (ZOFRAN ODT) 4 MG disintegrating tablet Allow 1-2 tablets to dissolve in your mouth every 8 hours as needed for nausea/vomiting 11/17/15   Hinda Kehr, MD  oxyCODONE-acetaminophen (ROXICET) 5-325 MG tablet Take 1-2 tablets by mouth every 4 (four) hours as needed for severe pain. 11/17/15   Hinda Kehr, MD  penicillin v potassium (VEETID) 500 MG tablet Take 1 tablet (500 mg total) by mouth 4 (four) times daily. 08/18/15   Marylene Land, NP    Allergies Review of patient's allergies indicates no known allergies.  Family History  Problem Relation Age of Onset  . Diabetes Father     Social History Social History  Substance Use Topics  . Smoking status: Never Smoker  . Smokeless tobacco: Never Used  . Alcohol use No    Review of Systems  Constitutional: Negative for fever. Cardiovascular: Negative for chest pain. Respiratory: Negative for shortness of breath. Gastrointestinal: Positive for abdominal pain, vaginal bleeding Neurological: Negative for headaches, focal weakness or numbness.  10-point ROS otherwise negative.  ____________________________________________   PHYSICAL EXAM:  VITAL SIGNS: ED Triage Vitals  Enc Vitals Group     BP 11/20/15 1237 137/84     Pulse  Rate 11/20/15 1237 83     Resp 11/20/15 1234 16     Temp 11/20/15 1234 98.7 F (37.1 C)     Temp Source 11/20/15 1234 Oral     SpO2 11/20/15 1237 99 %     Weight 11/20/15 1235 270 lb (122.5 kg)     Height 11/20/15 1235 5\' 8"  (1.727 m)     Head Circumference --      Peak Flow --      Pain Score 11/20/15 1235 6   Constitutional: Alert and oriented. Well appearing and in no distress. Eyes: Conjunctivae are normal. PERRL.  Normal extraocular movements. ENT   Head: Normocephalic and atraumatic.   Nose: No congestion/rhinnorhea.   Mouth/Throat: Mucous membranes are moist.   Neck: No stridor. Hematological/Lymphatic/Immunilogical: No cervical lymphadenopathy. Cardiovascular: Normal rate, regular rhythm.  No murmurs, rubs, or gallops. Respiratory: Normal respiratory effort without tachypnea nor retractions. Breath sounds are clear and equal bilaterally. No wheezes/rales/rhonchi. Gastrointestinal: Soft and nontender. No distention. There is no CVA tenderness. Genitourinary: Deferred Musculoskeletal: Normal range of motion in all extremities. No joint effusions.  No lower extremity tenderness nor edema. Neurologic:  Normal speech and language. No gross focal neurologic deficits are appreciated.  Skin:  Skin is warm, dry and intact. No rash noted. Psychiatric: Mood and affect are normal. Speech and behavior are normal. Patient exhibits appropriate insight and judgment.  ____________________________________________    LABS (pertinent positives/negatives)  Labs Reviewed  CBC WITH DIFFERENTIAL/PLATELET - Abnormal; Notable for the following:       Result Value   RBC 5.26 (*)    Hemoglobin 10.9 (*)    HCT 34.3 (*)    MCV 65.2 (*)    MCH 20.8 (*)    MCHC 31.9 (*)    RDW 19.4 (*)    All other components within normal limits  BASIC METABOLIC PANEL - Abnormal; Notable for the following:    Anion gap 4 (*)    All other components within normal limits  CBC - Abnormal; Notable for the following:    RBC 5.54 (*)    Hemoglobin 11.4 (*)    MCV 65.5 (*)    MCH 20.6 (*)    MCHC 31.4 (*)    RDW 18.9 (*)    All other components within normal limits  TYPE AND SCREEN     ____________________________________________   EKG  None  ____________________________________________     RADIOLOGY  None  ____________________________________________   PROCEDURES  Procedures  ____________________________________________   INITIAL IMPRESSION / ASSESSMENT AND PLAN / ED COURSE  Pertinent labs & imaging results that were available during my care of the patient were reviewed by me and considered in my medical decision making (see chart for details).  Patient presents to the emergency department today because of concerns for vaginal bleeding. Patient is not tachycardic nor hypotensive. The patient initial blood work shows slight increase in her anemia. Will plan on rechecking.  Clinical Course   Hgb stable on recheck. Advised patient to follow up with ob/gyn doctors. ____________________________________________   FINAL CLINICAL IMPRESSION(S) / ED DIAGNOSES  Final diagnoses:  Vaginal bleeding     Note: This dictation was prepared with Dragon dictation. Any transcriptional errors that result from this process are unintentional    Nance Pear, MD 11/20/15 1735

## 2015-11-20 NOTE — Discharge Instructions (Signed)
Please seek medical attention for any high fevers, chest pain, shortness of breath, change in behavior, persistent vomiting, bloody stool or any other new or concerning symptoms.  

## 2015-11-23 DIAGNOSIS — I10 Essential (primary) hypertension: Secondary | ICD-10-CM | POA: Insufficient documentation

## 2015-11-23 DIAGNOSIS — D219 Benign neoplasm of connective and other soft tissue, unspecified: Secondary | ICD-10-CM | POA: Insufficient documentation

## 2015-12-15 ENCOUNTER — Ambulatory Visit
Admission: EM | Admit: 2015-12-15 | Discharge: 2015-12-15 | Disposition: A | Discharge status: Home / Self Care | Payer: Medicaid Other | Attending: Registered Nurse | Admitting: Registered Nurse

## 2015-12-15 ENCOUNTER — Encounter: Payer: Self-pay | Admitting: Emergency Medicine

## 2015-12-15 DIAGNOSIS — K0889 Other specified disorders of teeth and supporting structures: Secondary | ICD-10-CM

## 2015-12-15 DIAGNOSIS — K029 Dental caries, unspecified: Secondary | ICD-10-CM | POA: Diagnosis not present

## 2015-12-15 MED ORDER — IBUPROFEN 800 MG PO TABS: 800.0000 mg | ORAL_TABLET | Freq: Three times a day (TID) | ORAL | 0 refills | Status: DC

## 2015-12-15 MED ORDER — ACETAMINOPHEN 500 MG PO TABS: 1000.0000 mg | ORAL_TABLET | Freq: Four times a day (QID) | ORAL | 0 refills | Status: DC | PRN

## 2015-12-15 MED ORDER — AMOXICILLIN 875 MG PO TABS: 875.0000 mg | ORAL_TABLET | Freq: Two times a day (BID) | ORAL | 0 refills | Status: AC

## 2015-12-15 MED ORDER — OXYCODONE-ACETAMINOPHEN 5-325 MG PO TABS: 1.0000 | ORAL_TABLET | Freq: Three times a day (TID) | ORAL | 0 refills | Status: AC | PRN

## 2015-12-15 NOTE — ED Triage Notes (Signed)
Patient c/o pain in her bottom gum and tooth for the past 2-3 days.

## 2015-12-15 NOTE — ED Provider Notes (Signed)
CSN: [id number]     Arrival date & time 12/15/15  1615 History   First MD Initiated Contact with Patient 12/15/15 1735     Chief Complaint  Patient presents with  . Dental Pain   (Consider location/radiation/quality/duration/timing/severity/associated sxs/prior Treatment) Single african [place] female here for evaluation tooth/gum pain and cheek swelling right lower molar.  Last finger stick 140 and 110 yesterday hasn't checked today.  Has broken off molar that has had recurrent infection but cannot afford dental surgery at this time.  Here for antibiotic Rx and pain medication as motrin alone not helping.  Denied trouble swallowing/speaking/breathing.      Past Medical History:  Diagnosis Date  . Diabetes mellitus without complication ([place])   . Fibroids    uterine  . Hypertension    Past Surgical History:  Procedure Laterality Date  . CHOLECYSTECTOMY     Family History  Problem Relation Age of Onset  . Diabetes Father    Social History  Substance Use Topics  . Smoking status: Never Smoker  . Smokeless tobacco: Never Used  . Alcohol use No   OB History    No data available     Review of Systems  Constitutional: Positive for fever. Negative for activity change, appetite change, chills, diaphoresis, fatigue and unexpected weight change.  HENT: Positive for dental problem, facial swelling and mouth sores. Negative for congestion, drooling, ear discharge, ear pain, hearing loss, nosebleeds, postnasal drip, rhinorrhea, sinus pressure, sneezing, sore throat, tinnitus, trouble swallowing and voice change.   Eyes: Negative for photophobia, pain, discharge, redness, itching and visual disturbance.  Respiratory: Negative for cough, choking, chest tightness, shortness of breath, wheezing and stridor.   Cardiovascular: Negative for chest pain, palpitations and leg swelling.  Gastrointestinal: Negative for abdominal distention, abdominal pain, blood in stool, constipation, diarrhea,  nausea and vomiting.  Endocrine: Negative for cold intolerance and heat intolerance.  Genitourinary: Negative for difficulty urinating, dysuria and hematuria.  Musculoskeletal: Negative for arthralgias, back pain, gait problem, joint swelling, myalgias, neck pain and neck stiffness.  Skin: Negative for color change, pallor, rash and wound.  Allergic/Immunologic: Negative for environmental allergies and food allergies.  Neurological: Positive for headaches. Negative for dizziness, tremors, seizures, syncope, facial asymmetry, speech difficulty, weakness, light-headedness and numbness.  Hematological: Negative for adenopathy. Does not bruise/bleed easily.  Psychiatric/Behavioral: Positive for sleep disturbance. Negative for agitation, behavioral problems and confusion.    Allergies  Review of patient's allergies indicates no known allergies.  Home Medications   Prior to Admission medications   Medication Sig Start Date End Date Taking? Authorizing Provider  acetaminophen (TYLENOL) 500 MG tablet Take 2 tablets (1,000 mg total) by mouth every 6 (six) hours as needed. 12/15/15   Olen Cordial, NP  amoxicillin (AMOXIL) 875 MG tablet Take 1 tablet (875 mg total) by mouth 2 (two) times daily. 12/15/15 12/25/15  Olen Cordial, NP  docusate sodium (COLACE) 100 MG capsule Take 1 tablet once or twice daily as needed for constipation while taking narcotic pain medicine 11/17/15   Hinda Kehr, MD  ferrous sulfate 325 (65 FE) MG tablet Take 325 mg by mouth daily with breakfast.    Historical Provider, MD  fluticasone (FLONASE) 50 MCG/ACT nasal spray Place 2 sprays into both nostrils daily. 08/06/15   Lorin Picket, PA-C  hydrochlorothiazide (HYDRODIURIL) 25 MG tablet Take 25 mg by mouth daily.    Historical Provider, MD  ibuprofen (ADVIL,MOTRIN) 800 MG tablet Take 1 tablet (800 mg total) by mouth 3 (  three) times daily. 12/15/15   Olen Cordial, NP  Insulin Aspart (NOVOLOG FLEXPEN [place]) Inject 20  Units into the skin.    Historical Provider, MD  insulin glargine (LANTUS) 100 UNIT/ML injection Inject 50 Units into the skin at bedtime.    Historical Provider, MD  metFORMIN (GLUCOPHAGE) 500 MG tablet Take 500 mg by mouth 2 (two) times daily with a meal.    Historical Provider, MD  ondansetron (ZOFRAN ODT) 4 MG disintegrating tablet Allow 1-2 tablets to dissolve in your mouth every 8 hours as needed for nausea/vomiting 11/17/15   Hinda Kehr, MD  oxyCODONE-acetaminophen (ROXICET) 5-325 MG tablet Take 1 tablet by mouth every 8 (eight) hours as needed for severe pain. 12/15/15 12/18/15  Olen Cordial, NP   Meds Ordered and Administered this Visit  Medications - No data to display  BP 138/86 (BP Location: Left Arm)   Pulse 95   Temp 98.2 F (36.8 C) (Oral)   Resp 16   Ht 5\' 8"  (1.727 m)   Wt 270 lb (122.5 kg)   LMP 12/01/2015 (Approximate)   SpO2 100%   BMI 41.05 kg/m  No data found.   Physical Exam  Constitutional: She is oriented to person, place, and time. Vital signs are normal. She appears well-developed and well-nourished. She is active and cooperative.  Non-toxic appearance. She does not have a sickly appearance. She does not appear ill. No distress.  HENT:  Head: Normocephalic and atraumatic.  Right Ear: Hearing, external ear and ear canal normal. A middle ear effusion is present.  Left Ear: Hearing, external ear and ear canal normal. A middle ear effusion is present.  Nose: No mucosal edema, rhinorrhea, nose lacerations, sinus tenderness, nasal deformity, septal deviation or nasal septal hematoma. No epistaxis.  No foreign bodies. Right sinus exhibits no maxillary sinus tenderness and no frontal sinus tenderness. Left sinus exhibits no maxillary sinus tenderness and no frontal sinus tenderness.  Mouth/Throat: Uvula is midline and mucous membranes are normal. Mucous membranes are not pale, not dry and not cyanotic. She does not have dentures. No oral lesions. No trismus in the  jaw. Normal dentition. Dental abscesses and dental caries present. No uvula swelling or lacerations. Posterior oropharyngeal edema and posterior oropharyngeal erythema present. No oropharyngeal exudate or tonsillar abscesses.    Cobblestoning posterior pharynx; bilateral TMs with air fluid air clear; nasal turbinates edema/erythema  Eyes: Conjunctivae, EOM and lids are normal. Pupils are equal, round, and reactive to light. Right eye exhibits no chemosis, no discharge, no exudate and no hordeolum. No foreign body present in the right eye. Left eye exhibits no chemosis, no discharge, no exudate and no hordeolum. No foreign body present in the left eye. Right conjunctiva is not injected. Right conjunctiva has no hemorrhage. Left conjunctiva is not injected. Left conjunctiva has no hemorrhage. No scleral icterus. Right eye exhibits normal extraocular motion and no nystagmus. Left eye exhibits normal extraocular motion and no nystagmus. Right pupil is round and reactive. Left pupil is round and reactive. Pupils are equal.  Neck: Trachea normal, normal range of motion and phonation normal. Neck supple. No tracheal tenderness, no spinous process tenderness and no muscular tenderness present. No neck rigidity. No tracheal deviation, no edema, no erythema and normal range of motion present. No thyroid mass and no thyromegaly present.  Cardiovascular: Normal rate, regular rhythm, S1 normal, S2 normal, normal heart sounds and intact distal pulses.  PMI is not displaced.  Exam reveals no gallop and no friction rub.  No murmur heard. Pulmonary/Chest: Effort normal and breath sounds normal. No accessory muscle usage or stridor. No respiratory distress. She has no decreased breath sounds. She has no wheezes. She has no rhonchi. She has no rales. She exhibits no tenderness.  Abdominal: Soft. She exhibits no distension.  Musculoskeletal: Normal range of motion. She exhibits no edema or tenderness.       Right shoulder:  Normal.       Left shoulder: Normal.       Right hip: Normal.       Left hip: Normal.       Right knee: Normal.       Left knee: Normal.       Cervical back: Normal.       Thoracic back: Normal.       Lumbar back: Normal.       Right hand: Normal.       Left hand: Normal.  Lymphadenopathy:       Head (right side): No submental, no submandibular, no tonsillar, no preauricular, no posterior auricular and no occipital adenopathy present.       Head (left side): No submental, no submandibular, no tonsillar, no preauricular, no posterior auricular and no occipital adenopathy present.    She has no cervical adenopathy.       Right cervical: No superficial cervical, no deep cervical and no posterior cervical adenopathy present.      Left cervical: No superficial cervical, no deep cervical and no posterior cervical adenopathy present.  Neurological: She is alert and oriented to person, place, and time. She has normal strength. She is not disoriented. She displays no atrophy and no tremor. No cranial nerve deficit or sensory deficit. She exhibits normal muscle tone. She displays no seizure activity. Coordination and gait normal. GCS eye subscore is 4. GCS verbal subscore is 5. GCS motor subscore is 6.  Skin: Skin is warm, dry and intact. Capillary refill takes less than 2 seconds. No abrasion, no bruising, no burn, no ecchymosis, no laceration, no lesion, no petechiae and no rash noted. She is not diaphoretic. No cyanosis or erythema. No pallor. Nails show no clubbing.  Psychiatric: She has a normal mood and affect. Her speech is normal and behavior is normal. Judgment and thought content normal. She is not actively hallucinating. Cognition and memory are normal. She is attentive.  Nursing note and vitals reviewed.   Urgent Care Course   Clinical Course    Procedures (including critical care time)  Labs Review Labs Reviewed - No data to display  Imaging Review No results found.    MDM    1. Pain, dental   2. Dental caries    Take antibiotic as ordered.  Amoxicillin 875mg  po BID x 14 days.  Tylenol 1000mg  po QID/motrin 800mg  po TID if no relief with oral care/listerine swishes/gargles after every meal than percocet 1 tab po q8h prn breakthrough moderate to severe pain Rx given 10 tabs.  Reviewed [place] Controlled substances website and patient has received 3 narcotic Rx over the past year for dental pain Sep 2016-Sep 2017  Salt water gargles po prn.  Good dental hygiene brushing, flossing, mouthwash BID.  Follow up with PCM/dentist if no improvement or worsening of symptoms e.g. fever, chills, dyspnea, SOB, purulent discharge, neck pain, eye pain. Encouraged patient to have dental work done as recurrent dental infections per Epic Review.  Did not require work note.  Avoid alcohol while taking percocet.  Avoid driving at least 8 hours  after taking percocet due to possible drowsiness/impaired thinking. Exitcare handout on dental abscess/dental pain/oral hygiene/preventive dental care given to patient.  Patient verbalized understanding of information/instructions agreed with plan of care and had no further questions at this time.    Olen Cordial, NP 12/15/15 2031

## 2016-12-18 ENCOUNTER — Ambulatory Visit
Admission: EM | Admit: 2016-12-18 | Discharge: 2016-12-18 | Disposition: A | Discharge status: Home / Self Care | Payer: Medicaid Other | Attending: Family Medicine | Admitting: Family Medicine

## 2016-12-18 DIAGNOSIS — R059 Cough, unspecified: Secondary | ICD-10-CM

## 2016-12-18 DIAGNOSIS — J0100 Acute maxillary sinusitis, unspecified: Secondary | ICD-10-CM

## 2016-12-18 DIAGNOSIS — R05 Cough: Secondary | ICD-10-CM

## 2016-12-18 DIAGNOSIS — J0110 Acute frontal sinusitis, unspecified: Secondary | ICD-10-CM

## 2016-12-18 MED ORDER — ALBUTEROL SULFATE HFA 108 (90 BASE) MCG/ACT IN AERS: 2.0000 | INHALATION_SPRAY | RESPIRATORY_TRACT | 0 refills | Status: DC | PRN

## 2016-12-18 MED ORDER — AMOXICILLIN-POT CLAVULANATE 875-125 MG PO TABS: 1.0000 | ORAL_TABLET | Freq: Two times a day (BID) | ORAL | 0 refills | Status: DC

## 2016-12-18 MED ORDER — PREDNISONE 20 MG PO TABS: 40.0000 mg | ORAL_TABLET | Freq: Every day | ORAL | 0 refills | Status: DC

## 2016-12-18 NOTE — Discharge Instructions (Signed)
Take medication as prescribed. Rest. Drink plenty of fluids.  ° °Follow up with your primary care physician this week as needed. Return to Urgent care for new or worsening concerns.  ° °

## 2016-12-18 NOTE — ED Provider Notes (Signed)
[facility] ____________________________________________  Time seen: Approximately 3:15 PM  I have reviewed the triage vital signs and the nursing notes.   HISTORY  Chief Complaint URI  HPI Joan Schmidt is a 36 y.o. female presenting for evaluation of runny nose, nasal congestion, cough, postnasal drainage and sinus pressure discomfort that has been present for 2 weeks. Patient states symptoms intermittently start to improve but then worsened again. States symptoms never resolved. Patient states her younger son has recently been sick with some similar complaints and now one of her daughters with some similar. States that she does have some chest congestion sensation in the last week with intermittent cough. States cough is worse at night. States has had a few episodes were she felt like she was wheezing and felt winded, but denies any other breathing discomfort. Denies current shortness of breath. Denies chest pain, chest pain with deep breath or hemoptysis. States has not been taken over-the-counter medications for the same complaints. Denies fevers. Reports has continued to remain active. Patient states she also feels that she doesn't feel good as she been stressed recently with working, going to school and taking care of her kids. Reports continues to eat and drink well. Denies aggravating or alleviating factors.   Denies chest pain, abdominal pain, dysuria, extremity pain, extremity swelling or rash. Denies recent sickness. Denies recent antibiotic use. Denies recent surgery or immobilization. Denies cardiac history. Denies renal insufficiency. States is a diabetic, on insulin, states blood sugars have been recently consistent with her normal.  [place]: PCP LMP: Patient reports intermittent bleeding due to uterine fibroids. Denies pregnancy.   Past Medical History:  Diagnosis Date  . Diabetes mellitus without complication ([place])   . Fibroids    uterine  . Hypertension     There are no active problems to display for this patient.   Past Surgical History:  Procedure Laterality Date  . CHOLECYSTECTOMY       No current facility-administered medications for this encounter.   Current Outpatient Prescriptions:  .  acetaminophen (TYLENOL) 500 MG tablet, Take 2 tablets (1,000 mg total) by mouth every 6 (six) hours as needed., Disp: 30 tablet, Rfl: 0 .  hydrochlorothiazide (HYDRODIURIL) 25 MG tablet, Take 25 mg by mouth daily., Disp: , Rfl:  .  Insulin Aspart (NOVOLOG FLEXPEN [place]), Inject 20 Units into the skin., Disp: , Rfl:  .  insulin glargine (LANTUS) 100 UNIT/ML injection, Inject 50 Units into the skin at bedtime., Disp: , Rfl:  .  metFORMIN (GLUCOPHAGE) 500 MG tablet, Take 500 mg by mouth 2 (two) times daily with a meal., Disp: , Rfl:  .  albuterol (PROVENTIL HFA;VENTOLIN HFA) 108 (90 Base) MCG/ACT inhaler, Inhale 2 puffs into the lungs every 4 (four) hours as needed., Disp: 1 Inhaler, Rfl: 0 .  amoxicillin-clavulanate (AUGMENTIN) 875-125 MG tablet, Take 1 tablet by mouth every 12 (twelve) hours., Disp: 20 tablet, Rfl: 0 .  docusate sodium (COLACE) 100 MG capsule, Take 1 tablet once or twice daily as needed for constipation while taking narcotic pain medicine, Disp: 30 capsule, Rfl: 0 .  ferrous sulfate 325 (65 FE) MG tablet, Take 325 mg by mouth daily with breakfast., Disp: , Rfl:  .  fluticasone (FLONASE) 50 MCG/ACT nasal spray, Place 2 sprays into both nostrils daily., Disp: 16 g, Rfl: 0 .  ibuprofen (ADVIL,MOTRIN) 800 MG tablet, Take 1 tablet (800 mg total) by mouth 3 (three) times daily., Disp: 21 tablet, Rfl: 0 .  ondansetron (ZOFRAN ODT)  4 MG disintegrating tablet, Allow 1-2 tablets to dissolve in your mouth every 8 hours as needed for nausea/vomiting, Disp: 30 tablet, Rfl: 0 .  predniSONE (DELTASONE) 20 MG tablet, Take 2 tablets (40 mg total) by mouth daily., Disp: 6 tablet, Rfl: 0  Allergies Ibuprofen  Family History    Problem Relation Age of Onset  . Diabetes Father     Social History Social History  Substance Use Topics  . Smoking status: Never Smoker  . Smokeless tobacco: Never Used  . Alcohol use No    Review of Systems Constitutional: No fever/chills Eyes: No visual changes. ENT: No sore throat.As above Cardiovascular: Denies chest pain. Respiratory: Denies shortness of breath. Gastrointestinal: No abdominal pain.  No nausea, no vomiting.  No diarrhea.   Genitourinary: Negative for dysuria. Musculoskeletal: Negative for back pain. Skin: Negative for rash.  ____________________________________________   PHYSICAL EXAM:  VITAL SIGNS: ED Triage Vitals [12/18/16 1421]  Enc Vitals Group     BP (!) 146/91     Pulse Rate 96     Resp 16     Temp 98.8 F (37.1 C)     Temp Source Oral     SpO2 99 %     Weight 270 lb (122.5 kg)     Height 5\' 8"  (1.727 m)     Head Circumference      Peak Flow      Pain Score 0     Pain Loc      Pain Edu?      Excl. in [place]?    Constitutional: Alert and oriented. Well appearing and in no acute distress. Eyes: Conjunctivae are normal.  Head: Atraumatic.Mild to moderate tenderness to palpation bilateral frontal and maxillary sinuses. No swelling. No erythema.   Ears: no erythema, normal TMs bilaterally.   Nose: nasal congestion with bilateral nasal turbinate erythema and edema.   Mouth/Throat: Mucous membranes are moist.  Oropharynx non-erythematous.No tonsillar swelling or exudate.  Neck: No stridor.  No cervical spine tenderness to palpation. Hematological/Lymphatic/Immunilogical: No cervical lymphadenopathy. Cardiovascular: Normal rate, regular rhythm. Grossly normal heart sounds.  Good peripheral circulation. Occasional dry cough noted with mild bronchospasm wheeze. Respiratory: Normal respiratory effort.  No retractions. No wheezes, rales or rhonchi. Good air movement.  Gastrointestinal: Soft and nontender.  Musculoskeletal: No lower extremity  tenderness nor edema. No cervical, thoracic or lumbar tenderness to palpation.  Neurologic:  Normal speech and language. No gait instability. Skin:  Skin is warm, dry and intact. No rash noted. Psychiatric: Mood and affect are normal. Speech and behavior are normal.  ___________________________________________   LABS (all labs ordered are listed, but only abnormal results are displayed)  Labs Reviewed - No data to display ____________________________________________  RADIOLOGY  No results found. ____________________________________________   PROCEDURES Procedures    INITIAL IMPRESSION / ASSESSMENT AND PLAN / ED COURSE  Pertinent labs & imaging results that were available during my care of the patient were reviewed by me and considered in my medical decision making (see chart for details).  Well-appearing patient. Suspect sinusitis with postnasal drainage. Patient noted to have slight bronchospasm with cough, lungs otherwise clear throughout. Discussed with patient we'll defer chest x-ray, patient agrees. Will start patient on oral Augmentin, prednisone and when necessary albuterol inhaler. Encouraged rest, fluids, supportive care. Discussed strict follow-up and return parameters.Discussed indication, risks and benefits of medications with patient, including monitoring blood sugar with prednisone.  Discussed follow up with Primary care physician this week. Discussed follow up and return  parameters including no resolution or any worsening concerns. Patient verbalized understanding and agreed to plan.   ____________________________________________   FINAL CLINICAL IMPRESSION(S) / ED DIAGNOSES  Final diagnoses:  Acute maxillary sinusitis, recurrence not specified  Acute frontal sinusitis, recurrence not specified  Cough     Discharge Medication List as of 12/18/2016  3:11 PM    START taking these medications   Details  albuterol (PROVENTIL HFA;VENTOLIN HFA) 108 (90 Base)  MCG/ACT inhaler Inhale 2 puffs into the lungs every 4 (four) hours as needed., Starting Wed 12/18/2016, Normal    amoxicillin-clavulanate (AUGMENTIN) 875-125 MG tablet Take 1 tablet by mouth every 12 (twelve) hours., Starting Wed 12/18/2016, Normal    predniSONE (DELTASONE) 20 MG tablet Take 2 tablets (40 mg total) by mouth daily., Starting Wed 12/18/2016, Normal        Note: This dictation was prepared with Dragon dictation along with smaller phrase technology. Any transcriptional errors that result from this process are unintentional.         Marylene Land, NP 12/18/16 1819

## 2016-12-18 NOTE — ED Triage Notes (Signed)
Patient complains of congestion, headaches, sneezing, shortness of breathness and cough that started 2 weeks ago worsening recently.

## 2017-03-27 IMAGING — US US PELVIS COMPLETE
1 series · 13 of 25 positions shown · non-contrast
Comparison: None

CLINICAL DATA: Right lower quadrant/pelvic pain, known uterine
fibroids



[Series 1: us pelvis complete · 0.24mm/px · 13 of 102 slices shown]
[im 1/102]
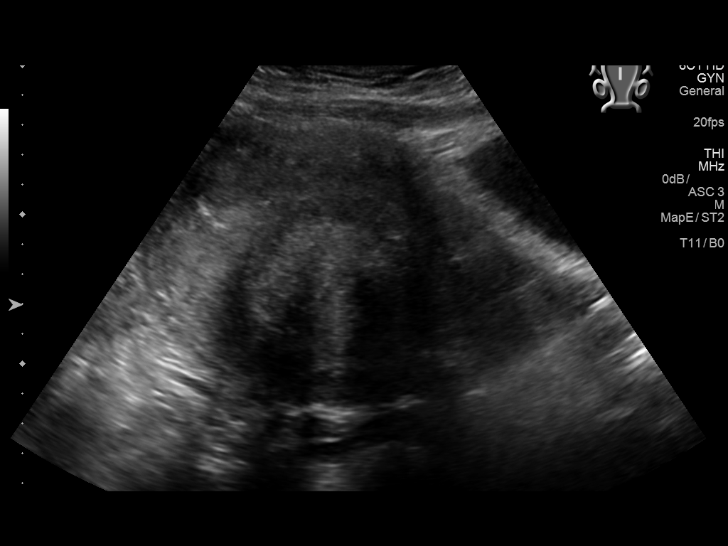
[im 9/102]
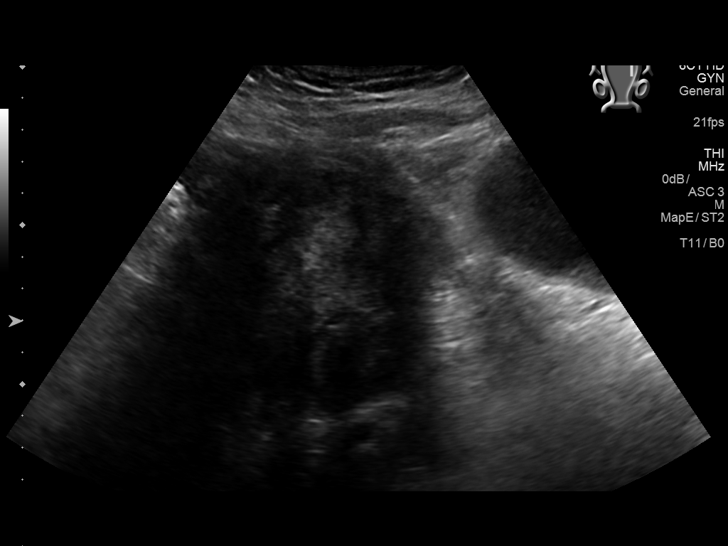
[im 17/102]
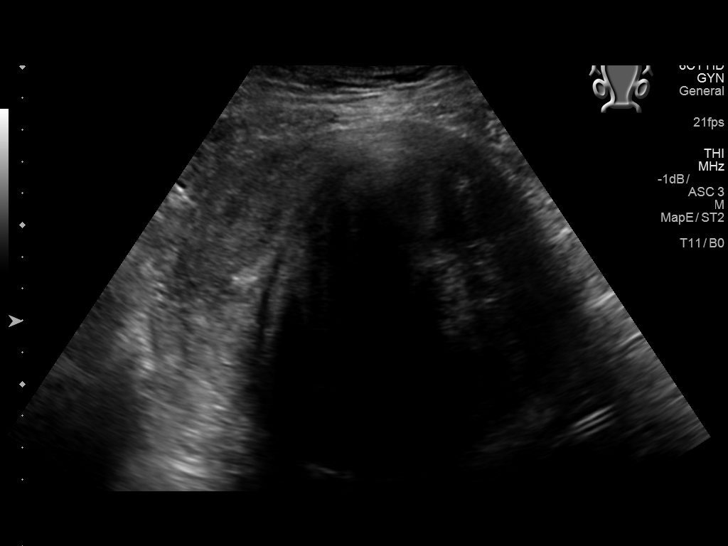
[im 26/102]
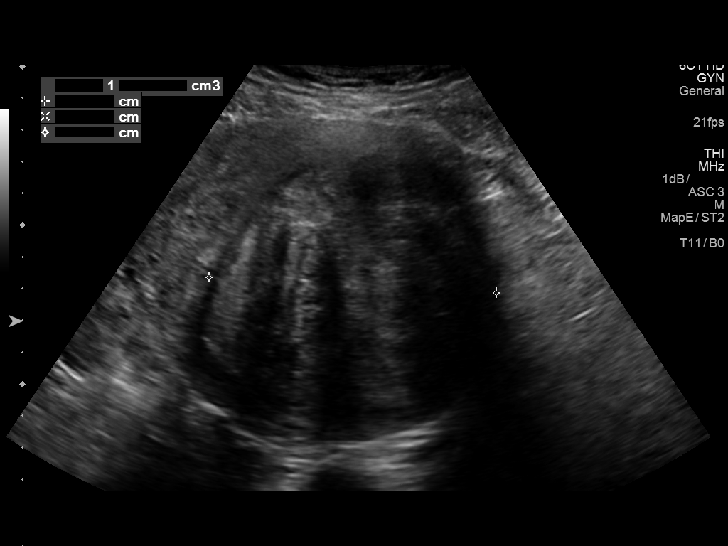
[im 34/102]
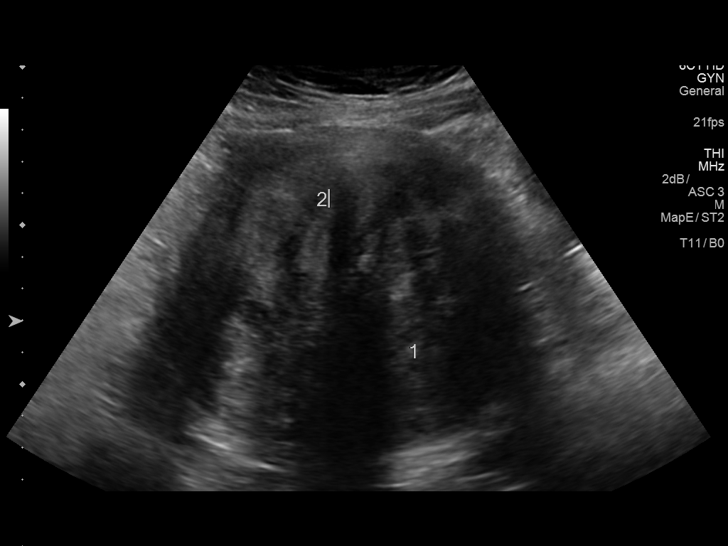
[im 43/102]
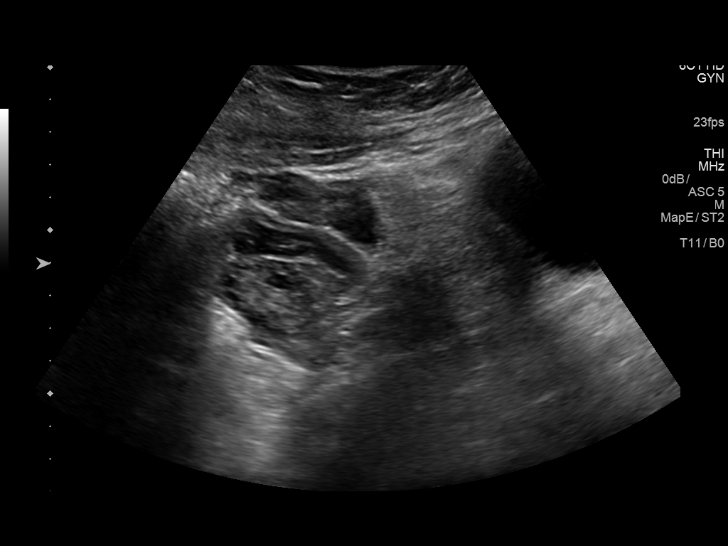
[im 51/102]
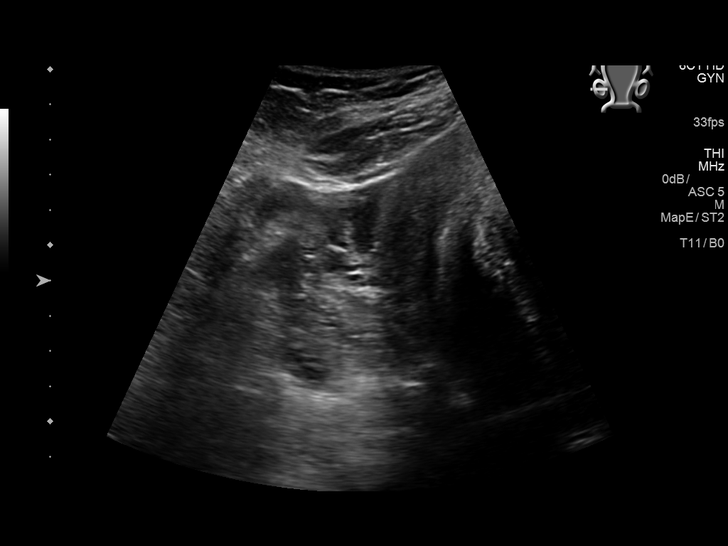
[im 59/102]
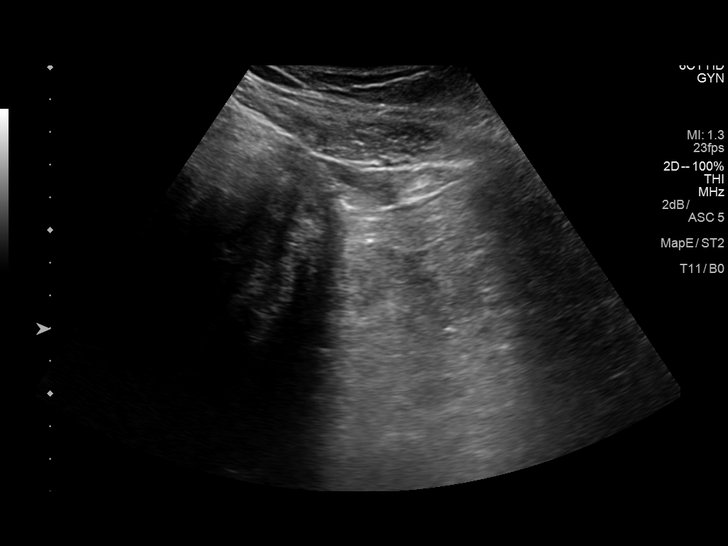
[im 68/102]
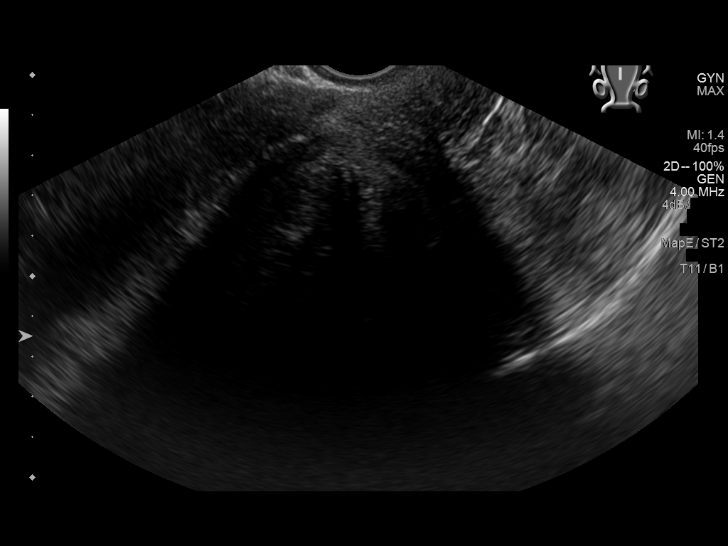
[im 76/102]
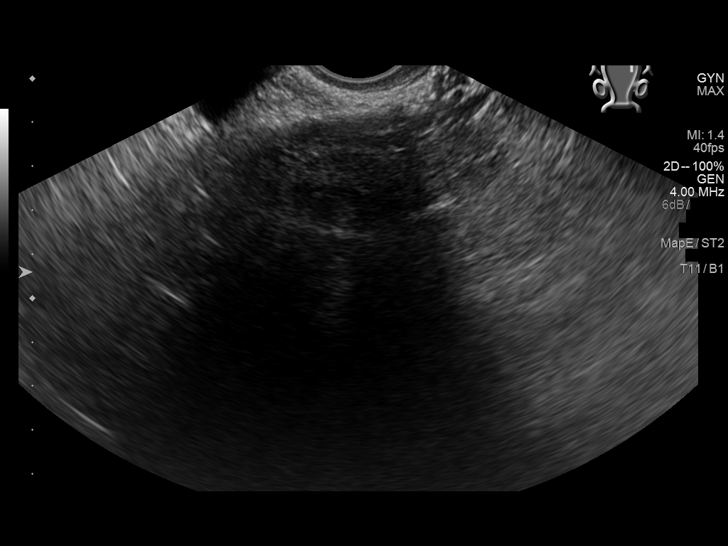
[im 85/102]
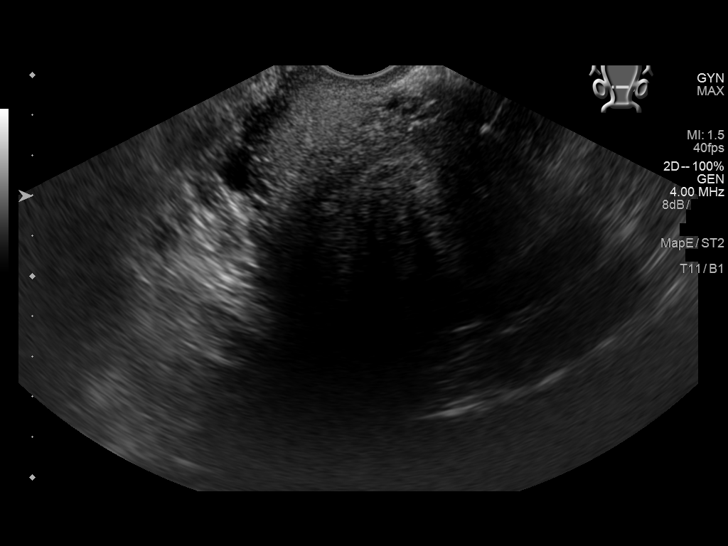
[im 93/102]
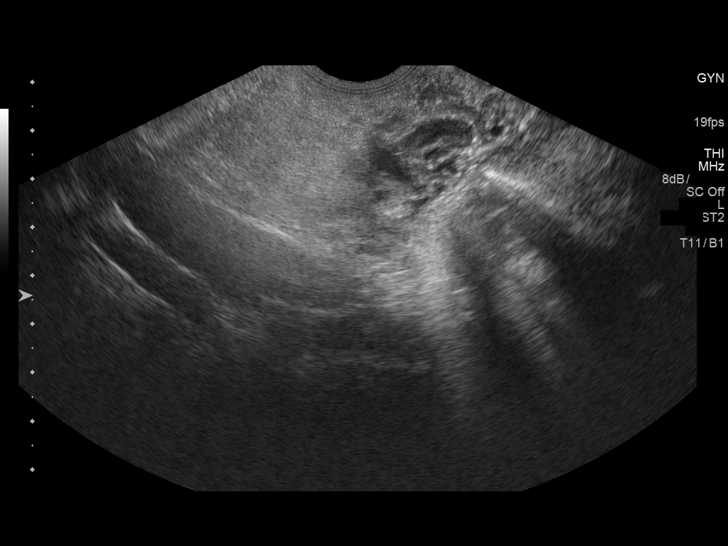
[im 102/102]
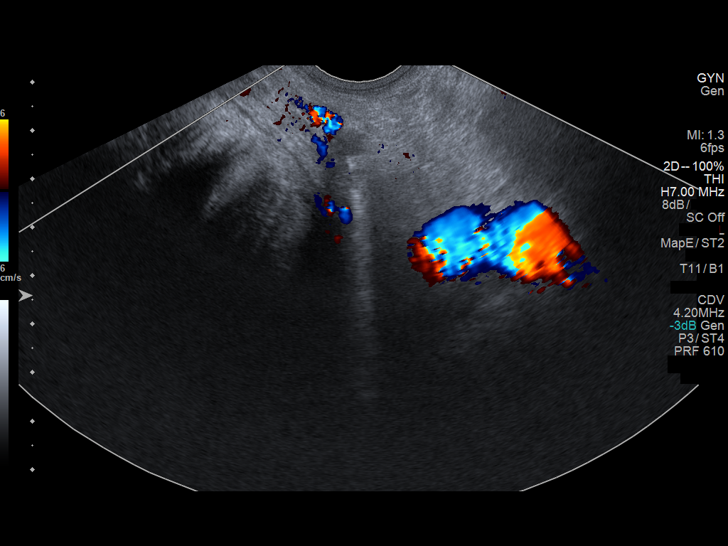

[13 of 25 positions shown; findings below may reference images not displayed]

FINDINGS: Uterus

Measurements: 15.1 x 9.4 x 11.3 cm. Multiple large uterine fibroids.
Dominant 8.7 x 8.7 x 9.1 cm intramural fibroid in the left uterine
body. Dominant 6.2 x 5.4 x 5.5 cm intramural fibroid in the left
anterior uterine fundus.

Endometrium

Thickness: 9 mm.  No focal abnormality visualized.

Right ovary

Measurements: 3.4 x 2.9 x 3.3 cm. Normal appearance/no adnexal mass.

Left ovary

Measurements: 3.5 x 1.9 x 3.3 cm. Normal appearance/no adnexal mass.

Other findings

No abnormal free fluid.
IMPRESSION: Enlarged uterus with two dominant uterine fibroids measuring up to
9.1 cm in the left uterine body.

Endometrial complex measures 9 mm, within normal limits.

Bilateral ovaries are unremarkable.

## 2017-10-22 ENCOUNTER — Encounter: Payer: Self-pay | Admitting: Emergency Medicine

## 2017-10-22 ENCOUNTER — Ambulatory Visit
Admission: EM | Admit: 2017-10-22 | Discharge: 2017-10-22 | Disposition: A | Discharge status: Home / Self Care | Payer: Medicaid Other | Attending: Family Medicine | Admitting: Family Medicine

## 2017-10-22 ENCOUNTER — Other Ambulatory Visit: Payer: Self-pay

## 2017-10-22 DIAGNOSIS — J029 Acute pharyngitis, unspecified: Secondary | ICD-10-CM | POA: Diagnosis present

## 2017-10-22 DIAGNOSIS — J039 Acute tonsillitis, unspecified: Secondary | ICD-10-CM

## 2017-10-22 DIAGNOSIS — I10 Essential (primary) hypertension: Secondary | ICD-10-CM | POA: Insufficient documentation

## 2017-10-22 DIAGNOSIS — Z794 Long term (current) use of insulin: Secondary | ICD-10-CM | POA: Insufficient documentation

## 2017-10-22 DIAGNOSIS — Z79899 Other long term (current) drug therapy: Secondary | ICD-10-CM | POA: Insufficient documentation

## 2017-10-22 DIAGNOSIS — E119 Type 2 diabetes mellitus without complications: Secondary | ICD-10-CM | POA: Insufficient documentation

## 2017-10-22 LAB — RAPID STREP SCREEN (MED CTR MEBANE ONLY): STREPTOCOCCUS, GROUP A SCREEN (DIRECT): NEGATIVE

## 2017-10-22 MED ORDER — AMOXICILLIN 875 MG PO TABS: 875.0000 mg | ORAL_TABLET | Freq: Two times a day (BID) | ORAL | 0 refills | Status: DC

## 2017-10-22 MED ORDER — LIDOCAINE VISCOUS HCL 2 % MT SOLN: OROMUCOSAL | 0 refills | Status: DC

## 2017-10-22 NOTE — ED Provider Notes (Signed)
[facility]    CSN: [id number] Arrival date & time: 10/22/17  1100     History   Chief Complaint Chief Complaint  Patient presents with  . Sore Throat    HPI Joan Schmidt is a 37 y.o. female.   The history is provided by the patient.  Sore Throat  This is a new problem. The current episode started more than 2 days ago. The problem occurs constantly. The problem has been gradually worsening. Pertinent negatives include no chest pain, no abdominal pain, no headaches and no shortness of breath. The symptoms are aggravated by swallowing. She has tried acetaminophen for the symptoms.    Past Medical History:  Diagnosis Date  . Diabetes mellitus without complication ([place])   . Fibroids    uterine  . Hypertension     There are no active problems to display for this patient.   Past Surgical History:  Procedure Laterality Date  . CHOLECYSTECTOMY      OB History   None      Home Medications    Prior to Admission medications   Medication Sig Start Date End Date Taking? Authorizing Provider  acetaminophen (TYLENOL) 500 MG tablet Take 2 tablets (1,000 mg total) by mouth every 6 (six) hours as needed. 12/15/15  Yes Betancourt, Aura Fey, NP  Exenatide ER (BYDUREON) 2 MG PEN Inject into the skin. 09/05/17  Yes [provider]  hydrochlorothiazide (HYDRODIURIL) 25 MG tablet Take 25 mg by mouth daily.   Yes [provider]  albuterol (PROVENTIL HFA;VENTOLIN HFA) 108 (90 Base) MCG/ACT inhaler Inhale 2 puffs into the lungs every 4 (four) hours as needed. 12/18/16   Marylene Land, NP  amoxicillin (AMOXIL) 875 MG tablet Take 1 tablet (875 mg total) by mouth 2 (two) times daily. 10/22/17   Norval Gable, MD  amoxicillin-clavulanate (AUGMENTIN) 875-125 MG tablet Take 1 tablet by mouth every 12 (twelve) hours. 12/18/16   Marylene Land, NP  docusate sodium (COLACE) 100 MG capsule Take 1 tablet once or twice daily as needed for constipation while taking  narcotic pain medicine 11/17/15   Hinda Kehr, MD  ferrous sulfate 325 (65 FE) MG tablet Take 325 mg by mouth daily with breakfast.    [provider]  fluticasone (FLONASE) 50 MCG/ACT nasal spray Place 2 sprays into both nostrils daily. 08/06/15   Lorin Picket, PA-C  ibuprofen (ADVIL,MOTRIN) 800 MG tablet Take 1 tablet (800 mg total) by mouth 3 (three) times daily. 12/15/15   Betancourt, Aura Fey, NP  Insulin Aspart (NOVOLOG FLEXPEN [place]) Inject 20 Units into the skin.    [provider]  insulin glargine (LANTUS) 100 UNIT/ML injection Inject 50 Units into the skin at bedtime.    [provider]  insulin NPH Human (HUMULIN N) 100 UNIT/ML injection Inject into the skin.    [provider]  lidocaine (XYLOCAINE) 2 % solution 20 ml gargle and spit q 6 hours prn sore throat 10/22/17   Norval Gable, MD  metFORMIN (GLUCOPHAGE) 500 MG tablet Take 500 mg by mouth 2 (two) times daily with a meal.    [provider]  ondansetron (ZOFRAN ODT) 4 MG disintegrating tablet Allow 1-2 tablets to dissolve in your mouth every 8 hours as needed for nausea/vomiting 11/17/15   Hinda Kehr, MD  predniSONE (DELTASONE) 20 MG tablet Take 2 tablets (40 mg total) by mouth daily. 12/18/16   Marylene Land, NP    Family History Family History  Problem Relation Age of Onset  .  Diabetes Father     Social History Social History   Tobacco Use  . Smoking status: Never Smoker  . Smokeless tobacco: Never Used  Substance Use Topics  . Alcohol use: No  . Drug use: No     Allergies   Ibuprofen   Review of Systems Review of Systems  Respiratory: Negative for shortness of breath.   Cardiovascular: Negative for chest pain.  Gastrointestinal: Negative for abdominal pain.  Neurological: Negative for headaches.     Physical Exam Triage Vital Signs ED Triage Vitals  Enc Vitals Group     BP 10/22/17 1111 (!) 145/93     Pulse Rate 10/22/17 1111 98     Resp 10/22/17  1111 17     Temp 10/22/17 1111 98 F (36.7 C)     Temp Source 10/22/17 1111 Oral     SpO2 10/22/17 1111 100 %     Weight 10/22/17 1111 (!) 306 lb (138.8 kg)     Height 10/22/17 1111 5\' 8"  (1.727 m)     Head Circumference --      Peak Flow --      Pain Score 10/22/17 1109 8     Pain Loc --      Pain Edu? --      Excl. in [place]? --    No data found.  Updated Vital Signs BP (!) 145/93 (BP Location: Left Arm)   Pulse 98   Temp 98 F (36.7 C) (Oral)   Resp 17   Ht 5\' 8"  (1.727 m)   Wt (!) 306 lb (138.8 kg)   SpO2 100%   BMI 46.53 kg/m   Visual Acuity Right Eye Distance:   Left Eye Distance:   Bilateral Distance:    Right Eye Near:   Left Eye Near:    Bilateral Near:     Physical Exam  Constitutional: She appears well-developed and well-nourished. No distress.  HENT:  Head: Normocephalic and atraumatic.  Right Ear: Tympanic membrane, external ear and ear canal normal.  Left Ear: Tympanic membrane, external ear and ear canal normal.  Nose: No mucosal edema, rhinorrhea, nose lacerations, sinus tenderness, nasal deformity, septal deviation or nasal septal hematoma. No epistaxis.  No foreign bodies. Right sinus exhibits no maxillary sinus tenderness and no frontal sinus tenderness. Left sinus exhibits no maxillary sinus tenderness and no frontal sinus tenderness.  Mouth/Throat: Uvula is midline and mucous membranes are normal. Posterior oropharyngeal erythema present. No oropharyngeal exudate, posterior oropharyngeal edema or tonsillar abscesses. Tonsils are 2+ on the right. Tonsils are 2+ on the left. No tonsillar exudate.  Eyes: Pupils are equal, round, and reactive to light. Conjunctivae and EOM are normal. Right eye exhibits no discharge. Left eye exhibits no discharge. No scleral icterus.  Neck: Normal range of motion. Neck supple. No thyromegaly present.  Cardiovascular: Normal rate, regular rhythm and normal heart sounds.  Pulmonary/Chest: Effort normal and breath sounds  normal. No stridor. No respiratory distress. She has no wheezes. She has no rales.  Lymphadenopathy:    She has cervical adenopathy.  Skin: She is not diaphoretic.  Nursing note and vitals reviewed.    UC Treatments / Results  Labs (all labs ordered are listed, but only abnormal results are displayed) Labs Reviewed  RAPID STREP SCREEN ([facility] ONLY)  CULTURE, GROUP A STREP [facility])    EKG None  Radiology No results found.  Procedures Procedures (including critical care time)  Medications Ordered in UC Medications - No data  to display  Initial Impression / Assessment and Plan / UC Course  I have reviewed the triage vital signs and the nursing notes.  Pertinent labs & imaging results that were available during my care of the patient were reviewed by me and considered in my medical decision making (see chart for details).     Final Clinical Impressions(s) / UC Diagnoses   Final diagnoses:  Tonsillitis    ED Prescriptions    Medication Sig Dispense Auth. Provider   amoxicillin (AMOXIL) 875 MG tablet Take 1 tablet (875 mg total) by mouth 2 (two) times daily. 20 tablet Johnross Nabozny, Linward Foster, MD   lidocaine (XYLOCAINE) 2 % solution 20 ml gargle and spit q 6 hours prn sore throat 100 mL Norval Gable, MD     1. Lab results and diagnosis reviewed with patient 2. rx as per orders above; reviewed possible side effects, interactions, risks and benefits  3. Recommend supportive treatment with rest, fluids, otc analgesics prn 4. Follow-up prn if symptoms worsen or don't improve   Controlled Substance Prescriptions [place] Controlled Substance Registry consulted? Not Applicable   Norval Gable, MD 10/22/17 1252

## 2017-10-22 NOTE — ED Triage Notes (Signed)
Pt here c/o sore throat. She has been feeling bad for months now but has been putting it off to get finished with school. Her throat got much worse yesterday. Her neck hurts, body hurts and congestion, SOB. He feels like she may have a temperature but has not checked it. She has been taking tylenol.

## 2017-10-25 LAB — CULTURE, GROUP A STREP (THRC)

## 2018-04-01 HISTORY — PX: EMBOLIZATION: SHX1496

## 2018-04-01 HISTORY — PX: UTERINE ARTERY EMBOLIZATION: SHX2629

## 2019-04-01 ENCOUNTER — Emergency Department: Payer: Medicaid Other

## 2019-04-01 ENCOUNTER — Other Ambulatory Visit: Payer: Self-pay

## 2019-04-01 ENCOUNTER — Emergency Department
Admission: EM | Admit: 2019-04-01 | Discharge: 2019-04-01 | Disposition: A | Discharge status: Home / Self Care | Payer: Medicaid Other | Attending: Emergency Medicine | Admitting: Emergency Medicine

## 2019-04-01 DIAGNOSIS — J189 Pneumonia, unspecified organism: Secondary | ICD-10-CM | POA: Diagnosis not present

## 2019-04-01 DIAGNOSIS — U071 COVID-19: Secondary | ICD-10-CM | POA: Diagnosis not present

## 2019-04-01 DIAGNOSIS — E119 Type 2 diabetes mellitus without complications: Secondary | ICD-10-CM | POA: Diagnosis not present

## 2019-04-01 DIAGNOSIS — Z794 Long term (current) use of insulin: Secondary | ICD-10-CM | POA: Diagnosis not present

## 2019-04-01 DIAGNOSIS — Z79899 Other long term (current) drug therapy: Secondary | ICD-10-CM | POA: Insufficient documentation

## 2019-04-01 DIAGNOSIS — I10 Essential (primary) hypertension: Secondary | ICD-10-CM | POA: Diagnosis not present

## 2019-04-01 DIAGNOSIS — J029 Acute pharyngitis, unspecified: Secondary | ICD-10-CM | POA: Diagnosis present

## 2019-04-01 LAB — URINALYSIS, COMPLETE (UACMP) WITH MICROSCOPIC
Bilirubin Urine: NEGATIVE
Glucose, UA: NEGATIVE mg/dL
Hgb urine dipstick: NEGATIVE
Ketones, ur: NEGATIVE mg/dL
Leukocytes,Ua: NEGATIVE
Nitrite: NEGATIVE
Protein, ur: 100 mg/dL — AB
Specific Gravity, Urine: 1.013 (ref 1.005–1.030)
pH: 7 (ref 5.0–8.0)

## 2019-04-01 LAB — GLUCOSE, CAPILLARY: Glucose-Capillary: 138 mg/dL — ABNORMAL HIGH (ref 70–99)

## 2019-04-01 LAB — GROUP A STREP BY PCR: Group A Strep by PCR: NOT DETECTED

## 2019-04-01 MED ORDER — AZITHROMYCIN 250 MG PO TABS: ORAL_TABLET | ORAL | 0 refills | Status: AC

## 2019-04-01 MED ORDER — ONDANSETRON 8 MG PO TBDP
8.0000 mg | ORAL_TABLET | Freq: Once | ORAL | Status: AC
  Administered 2019-04-01: 8 mg via ORAL
  Filled 2019-04-01: qty 1

## 2019-04-01 MED ORDER — METHYLPREDNISOLONE 4 MG PO TBPK: ORAL_TABLET | ORAL | 0 refills | Status: DC

## 2019-04-01 MED ORDER — ONDANSETRON HCL 8 MG PO TABS: 8.0000 mg | ORAL_TABLET | Freq: Three times a day (TID) | ORAL | 0 refills | Status: DC | PRN

## 2019-04-01 MED ORDER — LIDOCAINE VISCOUS HCL 2 % MT SOLN: 5.0000 mL | Freq: Four times a day (QID) | OROMUCOSAL | 0 refills | Status: DC | PRN

## 2019-04-01 NOTE — ED Notes (Signed)
Re-sent strep

## 2019-04-01 NOTE — ED Provider Notes (Signed)
[facility] Emergency Department Provider Note   ____________________________________________   First MD Initiated Contact with Patient 04/01/19 [phone]     (approximate)  I have reviewed the triage vital signs and the nursing notes.   HISTORY  Chief Complaint Sore Throat    HPI Joan Schmidt is a 38 y.o. female patient complains of sore throat and trouble swallowing for 1/2 days.  Patient also complained of nausea and fever.  Patient state test positive COVID-19 on 03/28/2019.  Patient denies pain.         Past Medical History:  Diagnosis Date  . Diabetes mellitus without complication ([place])   . Fibroids    uterine  . Hypertension     There are no problems to display for this patient.   Past Surgical History:  Procedure Laterality Date  . CHOLECYSTECTOMY      Prior to Admission medications   Medication Sig Start Date End Date Taking? Authorizing Provider  acetaminophen (TYLENOL) 500 MG tablet Take 2 tablets (1,000 mg total) by mouth every 6 (six) hours as needed. 12/15/15   Betancourt, Aura Fey, NP  albuterol (PROVENTIL HFA;VENTOLIN HFA) 108 (90 Base) MCG/ACT inhaler Inhale 2 puffs into the lungs every 4 (four) hours as needed. 12/18/16   Marylene Land, NP  azithromycin (ZITHROMAX Z-PAK) 250 MG tablet Take 2 tablets (500 mg) on  Day 1,  followed by 1 tablet (250 mg) once daily on Days 2 through 5. 04/01/19 04/06/19  Sable Feil, PA-C  docusate sodium (COLACE) 100 MG capsule Take 1 tablet once or twice daily as needed for constipation while taking narcotic pain medicine 11/17/15   Hinda Kehr, MD  Exenatide ER (BYDUREON) 2 MG PEN Inject into the skin. 09/05/17   [provider]  ferrous sulfate 325 (65 FE) MG tablet Take 325 mg by mouth daily with breakfast.    [provider]  fluticasone (FLONASE) 50 MCG/ACT nasal spray Place 2 sprays into both nostrils daily. 08/06/15   Lorin Picket, PA-C  hydrochlorothiazide (HYDRODIURIL)  25 MG tablet Take 25 mg by mouth daily.    [provider]  Insulin Aspart (NOVOLOG FLEXPEN [place]) Inject 20 Units into the skin.    [provider]  insulin glargine (LANTUS) 100 UNIT/ML injection Inject 50 Units into the skin at bedtime.    [provider]  insulin NPH Human (HUMULIN N) 100 UNIT/ML injection Inject into the skin.    [provider]  lidocaine (XYLOCAINE) 2 % solution Use as directed 5 mLs in the mouth or throat every 6 (six) hours as needed for mouth pain. 04/01/19   Sable Feil, PA-C  metFORMIN (GLUCOPHAGE) 500 MG tablet Take 500 mg by mouth 2 (two) times daily with a meal.    [provider]  methylPREDNISolone (MEDROL DOSEPAK) 4 MG TBPK tablet Take [place] dose as directed 04/01/19   Sable Feil, PA-C  ondansetron (ZOFRAN) 8 MG tablet Take 1 tablet (8 mg total) by mouth every 8 (eight) hours as needed for nausea or vomiting. 04/01/19   Sable Feil, PA-C    Allergies Ibuprofen  Family History  Problem Relation Age of Onset  . Diabetes Father     Social History Social History   Tobacco Use  . Smoking status: Never Smoker  . Smokeless tobacco: Never Used  Substance Use Topics  . Alcohol use: No  . Drug use: No    Review of Systems Constitutional: No fever/chills Eyes: No visual changes.  ENT: Sore throat. Cardiovascular: Denies chest pain. Respiratory: Denies shortness of breath. Gastrointestinal: No abdominal pain.  No nausea, no vomiting.  No diarrhea.  No constipation. Genitourinary: Negative for dysuria. Musculoskeletal: Negative for back pain. Skin: Negative for rash. Neurological: Negative for headaches, focal weakness or numbness. Endocrine:  Diabetes and hypertension. Allergic/Immunilogical: Ibuprofen ____________________________________________   PHYSICAL EXAM:  VITAL SIGNS: ED Triage Vitals [04/01/19 0332]  Enc Vitals Group     BP (!) 141/89     Pulse Rate (!) 105     Resp 18     Temp  (!) 101.3 F (38.5 C)     Temp Source Oral     SpO2 97 %     Weight 297 lb (134.7 kg)     Height 5\' 8"  (1.727 m)     Head Circumference      Peak Flow      Pain Score 0     Pain Loc      Pain Edu?      Excl. in [place]?     Constitutional: Alert and oriented. Well appearing and in no acute distress. Eyes: Conjunctivae are normal. PERRL. EOMI. Head: Atraumatic. Nose: No congestion/rhinnorhea. Mouth/Throat: Mucous membranes are moist.  Oropharynx non-erythematous. Neck: No stridor.   Hematological/Lymphatic/Immunilogical: No cervical lymphadenopathy. Cardiovascular: Normal rate, regular rhythm. Grossly normal heart sounds.  Good peripheral circulation. Respiratory: Normal respiratory effort.  No retractions. Lungs CTAB. Gastrointestinal: Soft and nontender. No distention. No abdominal bruits. No CVA tenderness. Genitourinary: Deferred Neurologic:  Normal speech and language. No gross focal neurologic deficits are appreciated. No gait instability. Skin:  Skin is warm, dry and intact. No rash noted. Psychiatric: Mood and affect are normal. Speech and behavior are normal.  ____________________________________________   LABS (all labs ordered are listed, but only abnormal results are displayed)  Labs Reviewed  URINALYSIS, COMPLETE (UACMP) WITH MICROSCOPIC - Abnormal; Notable for the following components:      Result Value   Color, Urine YELLOW (*)    APPearance HAZY (*)    Protein, ur 100 (*)    Bacteria, UA RARE (*)    All other components within normal limits  GLUCOSE, CAPILLARY - Abnormal; Notable for the following components:   Glucose-Capillary 138 (*)    All other components within normal limits  GROUP A STREP BY PCR  CBG MONITORING, ED   ____________________________________________  EKG   ____________________________________________  RADIOLOGY  ED MD interpretation:    Official radiology report(s): DG Neck Soft Tissue  Result Date: 04/01/2019 CLINICAL DATA:   Sore throat with dysphagia and chest tightness. COVID-19 positive EXAM: NECK SOFT TISSUES - 1+ VIEW COMPARISON:  None. FINDINGS: Frontal and lateral views were obtained. The epiglottis and aryepiglottic folds appear normal. Prevertebral soft tissues appear normal. No air-fluid level to suggest abscess. The tongue base region appears normal. No appreciable tracheal narrowing evident. Visualized upper lungs are clear. No bony lesions evident. IMPRESSION: No abnormality noted. Electronically Signed   By: Lowella Grip III M.D.   On: 04/01/2019 07:54   DG Chest Portable 1 View  Result Date: 04/01/2019 CLINICAL DATA:  38 year old female with history of dysphagia. Positive for COVID-19 infection. EXAM: PORTABLE CHEST 1 VIEW COMPARISON:  No priors. FINDINGS: Lung volumes are low. Patchy ill-defined opacities throughout the mid to lower lungs bilaterally, concerning for multilobar pneumonia. No definite pleural effusions. Cephalization of the pulmonary vasculature, without frank pulmonary edema. Mild cardiomegaly. Upper mediastinal contours are within normal limits allowing for patient's rotation to the left. IMPRESSION:  1. Patchy ill-defined opacities throughout the mid to lower lungs bilaterally, concerning for multilobar bilateral pneumonia. 2. Cardiomegaly with pulmonary venous congestion, without frank pulmonary edema. Electronically Signed   By: Vinnie Langton M.D.   On: 04/01/2019 10:08    ____________________________________________   PROCEDURES  Procedure(s) performed (including Critical Care):  Procedures   ____________________________________________   INITIAL IMPRESSION / ASSESSMENT AND PLAN / ED COURSE  As part of my medical decision making, I reviewed the following data within the electronic MEDICAL RECORD NUMBER         Joan Schmidt was evaluated in Emergency Department on 04/01/2019 for the symptoms described in the history of present illness. She was evaluated in the context of  the global COVID-19 pandemic, which necessitated consideration that the patient might be at risk for infection with the SARS-CoV-2 virus that causes COVID-19. Institutional protocols and algorithms that pertain to the evaluation of patients at risk for COVID-19 are in a state of rapid change based on information released by regulatory bodies including the CDC and federal and state organizations. These policies and algorithms were followed during the patient's care in the ED.  Patient arrived via EMS complaining of sore throat and dysphagia.  Patient is Covid 19+.  Patient rapid strep test was negative.  Discussed negative soft tissue neck x-ray findings with patient.  Patient chest x-ray is consistent with early pneumonia.  Patient given discharge care instructions and advised to start medication as directed.  Return to ED if condition worsens.     ____________________________________________   FINAL CLINICAL IMPRESSION(S) / ED DIAGNOSES  Final diagnoses:  Community acquired pneumonia, unspecified laterality  COVID-19     ED Discharge Orders         Ordered    methylPREDNISolone (MEDROL DOSEPAK) 4 MG TBPK tablet     04/01/19 1014    azithromycin (ZITHROMAX Z-PAK) 250 MG tablet     04/01/19 1014    ondansetron (ZOFRAN) 8 MG tablet  Every 8 hours PRN     04/01/19 1014    lidocaine (XYLOCAINE) 2 % solution  Every 6 hours PRN     04/01/19 1014           Note:  This document was prepared using Dragon voice recognition software and may include unintentional dictation errors.    Sable Feil, PA-C 04/01/19 1018    Duffy Bruce, MD 04/01/19 [phone]

## 2019-04-01 NOTE — ED Notes (Signed)
See triage note  Presents with sore throat,nausea and some tightness in chest  States she tested positive for COVID 3-4 days ago  Low grade fever on arrival

## 2019-04-01 NOTE — Discharge Instructions (Signed)
Advised to follow discharge care instructions and continue self quarantine.  Start medications as directed.

## 2020-06-19 ENCOUNTER — Other Ambulatory Visit: Payer: Self-pay

## 2020-06-19 ENCOUNTER — Ambulatory Visit
Admission: EM | Admit: 2020-06-19 | Discharge: 2020-06-19 | Disposition: A | Discharge status: Home / Self Care | Payer: Medicaid Other | Attending: Family Medicine | Admitting: Family Medicine

## 2020-06-19 DIAGNOSIS — R0602 Shortness of breath: Secondary | ICD-10-CM | POA: Diagnosis not present

## 2020-06-19 DIAGNOSIS — R0789 Other chest pain: Secondary | ICD-10-CM | POA: Diagnosis not present

## 2020-06-19 DIAGNOSIS — T364X5A Adverse effect of tetracyclines, initial encounter: Secondary | ICD-10-CM | POA: Diagnosis not present

## 2020-06-19 DIAGNOSIS — T50905A Adverse effect of unspecified drugs, medicaments and biological substances, initial encounter: Secondary | ICD-10-CM

## 2020-06-19 MED ORDER — PREDNISONE 10 MG (21) PO TBPK: ORAL_TABLET | ORAL | 0 refills | Status: DC

## 2020-06-19 MED ORDER — SULFAMETHOXAZOLE-TRIMETHOPRIM 800-160 MG PO TABS: 1.0000 | ORAL_TABLET | Freq: Two times a day (BID) | ORAL | 0 refills | Status: AC

## 2020-06-19 NOTE — ED Triage Notes (Signed)
Pt c/o possible allergic reaction to doxycycline she started yesterday for abscess. She has taken 4 tablets so far. Pt reports it feels her throat is closing. She has checked her O2 at home and it was 100%, also here in office. She denies any facial swelling, hives or other symptoms. She does report her neck hurts.

## 2020-06-19 NOTE — Discharge Instructions (Signed)
Stop the doxy.  Start the other medication & the steroids.  Take care  Dr. Lacinda Axon

## 2020-06-20 NOTE — ED Provider Notes (Signed)
[facility]    CSN: [id number] Arrival date & time: 06/19/20  1925  History   Chief Complaint Chief Complaint  Patient presents with  . Allergic Reaction   HPI  40 year old female presents with the above complaint.  Started on doxycycline yesterday for abscess. Has taken 4 doses. Developed chest tightness, SOB today. Also feels like her throat is tight and is closing up. No oral or facial edema. No relieving factors. No rash or other symptoms. No other complaints.    Past Medical History:  Diagnosis Date  . Diabetes mellitus without complication ([place])   . Fibroids    uterine  . Hypertension    Past Surgical History:  Procedure Laterality Date  . CHOLECYSTECTOMY      OB History   No obstetric history on file.      Home Medications    Prior to Admission medications   Medication Sig Start Date End Date Taking? Authorizing Provider  acetaminophen (TYLENOL) 500 MG tablet Take 2 tablets (1,000 mg total) by mouth every 6 (six) hours as needed. 12/15/15  Yes Betancourt, Aura Fey, NP  albuterol (PROVENTIL HFA;VENTOLIN HFA) 108 (90 Base) MCG/ACT inhaler Inhale 2 puffs into the lungs every 4 (four) hours as needed. 12/18/16  Yes Marylene Land, NP  ferrous sulfate 325 (65 FE) MG tablet Take 325 mg by mouth daily with breakfast.   Yes [provider]  fluticasone (FLONASE) 50 MCG/ACT nasal spray Place 2 sprays into both nostrils daily. 08/06/15  Yes Lorin Picket, PA-C  HUMULIN R 500 UNIT/ML injection Inject 20 Units into the skin in the morning, at noon, and at bedtime. 05/26/20  Yes [provider]  losartan-hydrochlorothiazide (HYZAAR) 100-25 MG tablet Take 1 tablet by mouth daily. 10/15/19 10/14/20 Yes [provider]  [place], 1 MG/DOSE, 4 MG/3ML SOPN  06/14/20  Yes [provider]  sulfamethoxazole-trimethoprim (BACTRIM DS) 800-160 MG tablet Take 1 tablet by mouth 2 (two) times daily for 7 days. 06/19/20 06/26/20 Yes Bridgette Wolden G, DO   tranexamic acid (LYSTEDA) 650 MG TABS tablet Take 1,300 mg by mouth 3 (three) times daily. 06/16/20  Yes [provider]  docusate sodium (COLACE) 100 MG capsule Take 1 tablet once or twice daily as needed for constipation while taking narcotic pain medicine 11/17/15   Hinda Kehr, MD  predniSONE (STERAPRED UNI-PAK 21 TAB) 10 MG (21) TBPK tablet 6 tablets on day 1; decrease by 1 tablet daily until gone. 06/19/20  Yes Airika Alkhatib G, DO  Exenatide ER 2 MG PEN Inject into the skin. 09/05/17 06/19/20  [provider]  metFORMIN (GLUCOPHAGE) 500 MG tablet Take 500 mg by mouth 2 (two) times daily with a meal.  06/19/20  [provider]    Family History Family History  Problem Relation Age of Onset  . Diabetes Father     Social History Social History   Tobacco Use  . Smoking status: Never Smoker  . Smokeless tobacco: Never Used  Vaping Use  . Vaping Use: Never used  Substance Use Topics  . Alcohol use: No  . Drug use: No     Allergies   Patient has no known allergies.   Review of Systems Review of Systems Per HPI  Physical Exam Triage Vital Signs ED Triage Vitals  Enc Vitals Group     BP 06/19/20 1949 (!) 144/86     Pulse Rate 06/19/20 1949 73     Resp 06/19/20 1949 18  Temp 06/19/20 1949 98.4 F (36.9 C)     Temp Source 06/19/20 1949 Oral     SpO2 06/19/20 1949 100 %     Weight 06/19/20 1945 (!) 302 lb (137 kg)     Height 06/19/20 1945 5\' 8"  (1.727 m)     Head Circumference --      Peak Flow --      Pain Score 06/19/20 1944 8     Pain Loc --      Pain Edu? --      Excl. in [place]? --    Updated Vital Signs BP (!) 144/86 (BP Location: Left Arm)   Pulse 73   Temp 98.4 F (36.9 C) (Oral)   Resp 18   Ht 5\' 8"  (1.727 m)   Wt (!) 137 kg   SpO2 100%   BMI 45.92 kg/m   Visual Acuity Right Eye Distance:   Left Eye Distance:   Bilateral Distance:    Right Eye Near:   Left Eye Near:    Bilateral Near:     Physical Exam Vitals and  nursing note reviewed.  Constitutional:      General: She is not in acute distress.    Appearance: Normal appearance. She is obese. She is not ill-appearing.  HENT:     Head: Normocephalic and atraumatic.     Mouth/Throat:     Pharynx: Oropharynx is clear.  Eyes:     General:        Right eye: No discharge.        Left eye: No discharge.     Conjunctiva/sclera: Conjunctivae normal.  Cardiovascular:     Rate and Rhythm: Normal rate and regular rhythm.  Pulmonary:     Effort: Pulmonary effort is normal.     Breath sounds: Normal breath sounds. No wheezing or rales.  Neurological:     Mental Status: She is alert.  Psychiatric:        Mood and Affect: Mood normal.        Behavior: Behavior normal.    UC Treatments / Results  Labs (all labs ordered are listed, but only abnormal results are displayed) Labs Reviewed - No data to display  EKG   Radiology No results found.  Procedures Procedures (including critical care time)  Medications Ordered in UC Medications - No data to display  Initial Impression / Assessment and Plan / UC Course  I have reviewed the triage vital signs and the nursing notes.  Pertinent labs & imaging results that were available during my care of the patient were reviewed by me and considered in my medical decision making (see chart for details).    40 year old female presents with an adverse medication effect. Stopping Doxycycline. Starting bactrim (for abscess). Placing on prednisone for the adverse medication effect/allergic response.  Final Clinical Impressions(s) / UC Diagnoses   Final diagnoses:  Adverse effect of drug, initial encounter     Discharge Instructions     Stop the doxy.  Start the other medication & the steroids.  Take care  Dr. Lacinda Axon    ED Prescriptions    Medication Sig Dispense Auth. Provider   sulfamethoxazole-trimethoprim (BACTRIM DS) 800-160 MG tablet Take 1 tablet by mouth 2 (two) times daily for 7 days. 14  tablet Kiyra Slaubaugh G, DO   predniSONE (STERAPRED UNI-PAK 21 TAB) 10 MG (21) TBPK tablet 6 tablets on day 1; decrease by 1 tablet daily until gone. 21 tablet [place], [place], [place]  PDMP not reviewed this encounter.   Coral Spikes, [place] 06/20/20 1709

## 2020-07-15 IMAGING — CR DG NECK SOFT TISSUE
1 series · 2 of 2 positions shown · non-contrast
Comparison: None.

CLINICAL DATA: Sore throat with dysphagia and chest tightness.
[id number]-0H positive

EXAM:
NECK SOFT TISSUES - 1+ VIEW

[Series 1: dg neck soft tissue · 0.14mm/px · 2 of 2 slices shown]
[im 1/2]
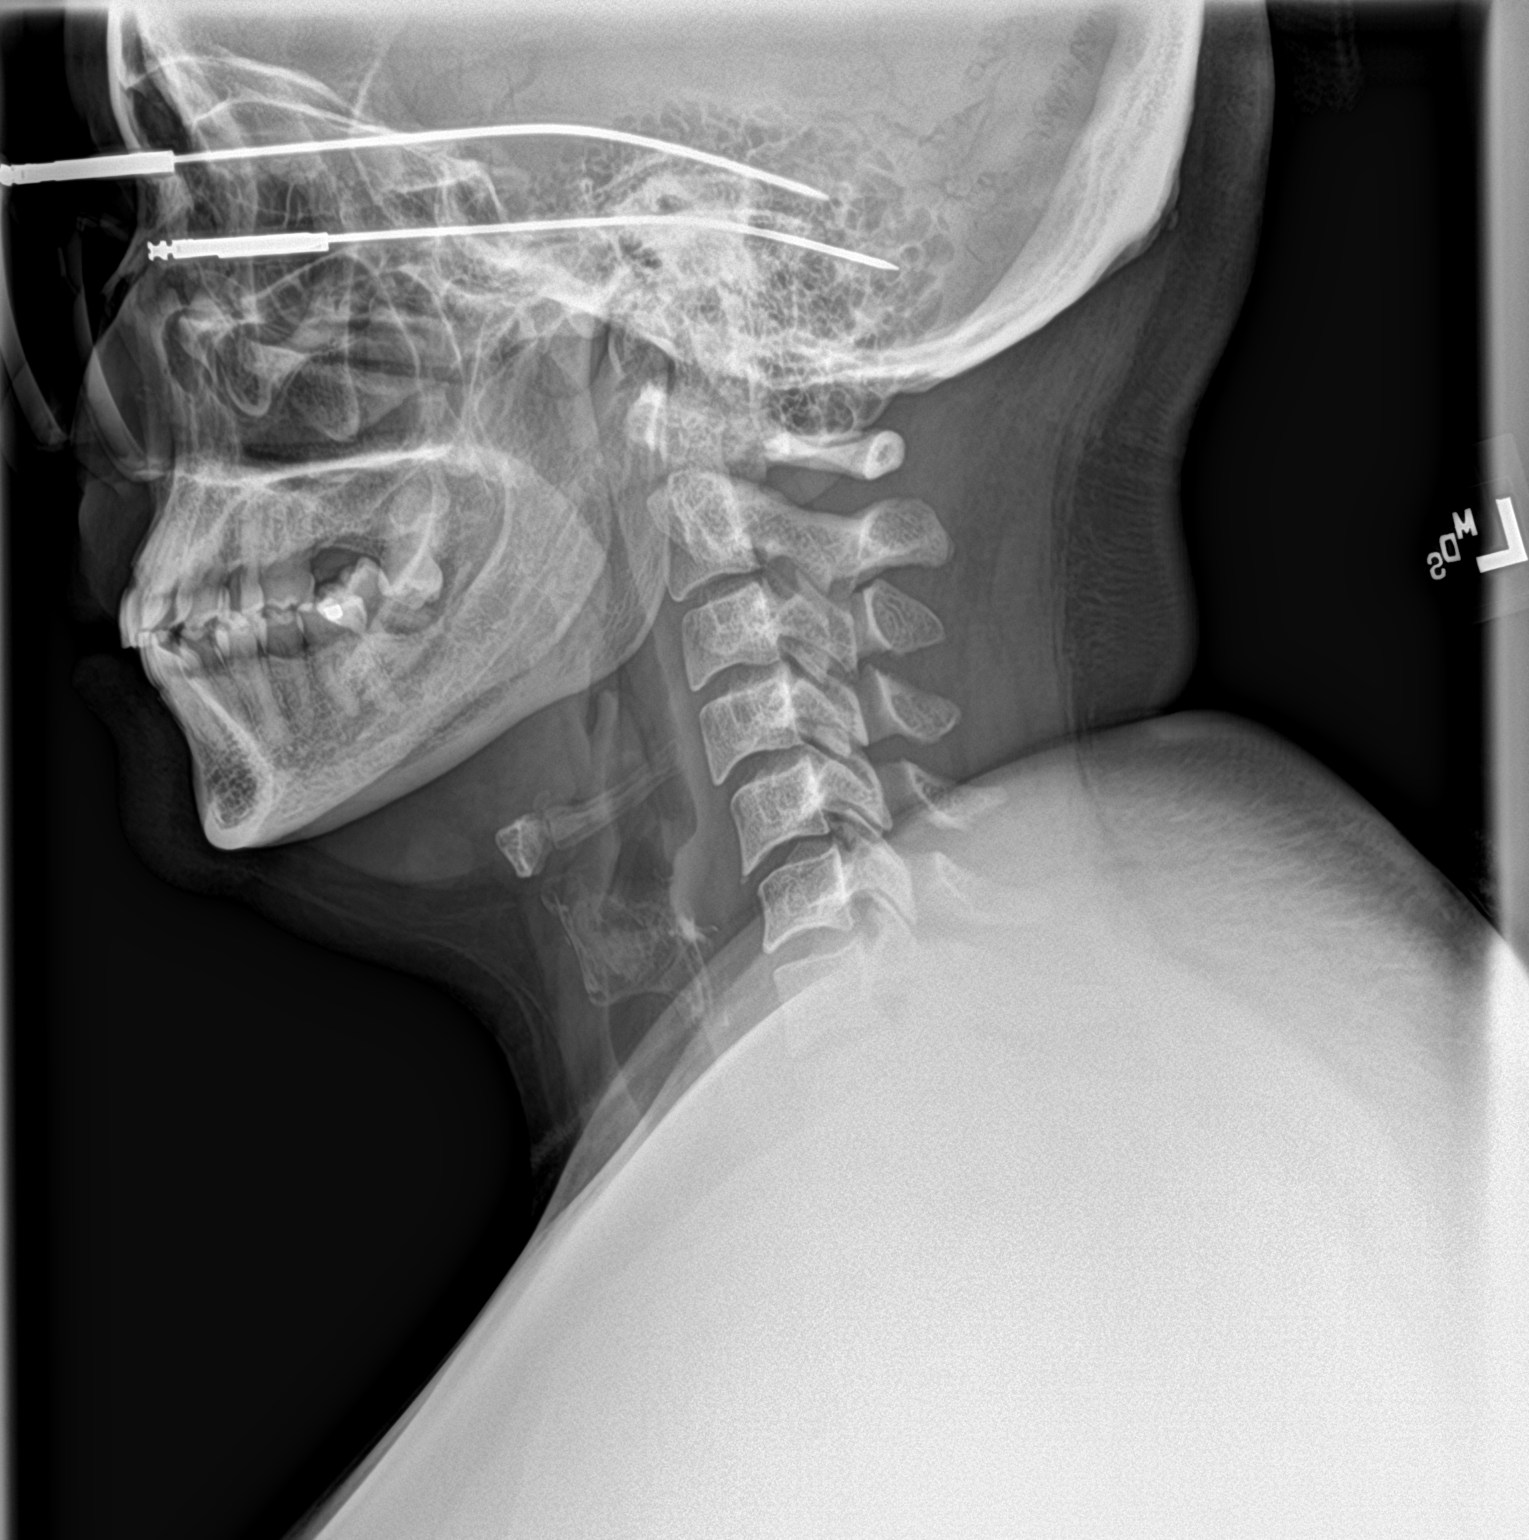
[im 2/2]
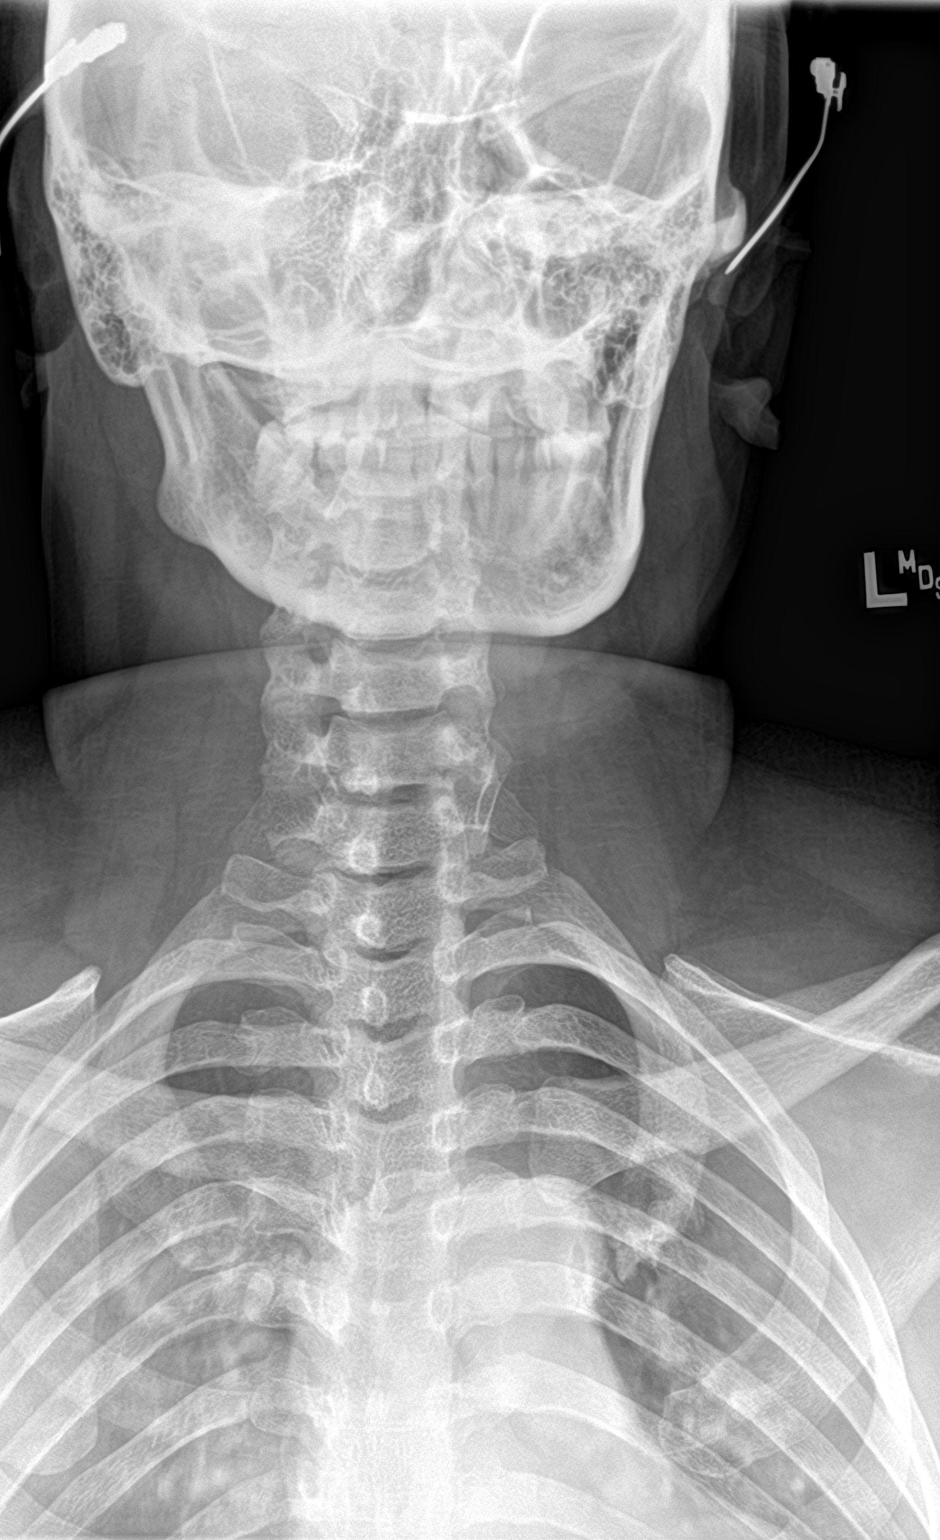

[2 of 2 positions shown; findings below may reference images not displayed]

FINDINGS: Frontal and lateral views were obtained. The epiglottis and
aryepiglottic folds appear normal. Prevertebral soft tissues appear
normal. No air-fluid level to suggest abscess. The tongue base
region appears normal. No appreciable tracheal narrowing evident.
Visualized upper lungs are clear. No bony lesions evident.
IMPRESSION: No abnormality noted.

## 2021-11-26 ENCOUNTER — Encounter (HOSPITAL_BASED_OUTPATIENT_CLINIC_OR_DEPARTMENT_OTHER): Payer: Self-pay | Admitting: Obstetrics and Gynecology

## 2021-11-26 NOTE — H&P (Addendum)
Joan Schmidt is an 41 y.o. G4P3 who is admitted for Robotic Assisted Total Laparoscopic Hysterectomy with bilateral salpingectomy for abnormal uterine bleeding.  Patient has a long-standing history of AUB which has not been controlled with multiple medical therapies and [place]. She was previously followed at [facility]. She has previously used Depo Provera (which she is currently on), Provera, Depo Lupron, OCPs, Mirena IUD x 2, Lysteda, and [place] (2020) all of which have failed to control her bleeding. The IUD has somewhat decreased the heaviness of her bleeding but she still had sporadic bleeding which significantly affected the quality of her life. Her first IUD was remove/replaced due to malposition in 2020. Her second Mirena IUD began to expel spontaneously 05/2021 and was removed. Patient has had difficulty exercising due to the excessive bleeding. Patient has had three prior EMBs which were all negative (2016, 2018, and 2020). She declines repeat EMB due to significant pain with each of the procedures.  Of note, patient has been working with her Endocrinologist to get her Type II diabetes under control. She is taking Ozempic and insulin for diabetes and her most recent HgbA1C is 8.4. She has worked very hard by changing her diet to get her HgbA1C down from 9.3.  Patient has history of three vaginal deliveries, largest baby was 9lbs 10oz.  Work-up: Pap smear (2020): NILM/HRHPV negative (records visible in [place]) EMB (2020): Secretory endometrium, no EIN or carcinoma identified EMB (2018): Stromal breakdown and alternations in gland architecture suggestive of anovulation EMB (2016): Unavailable in EHR TSH (2019): 1.265 (wnl) HgbA1C (08/2021): 8.4 HgbA1C (05/2021): 9.3 CBC (05/2021): WBC 6.6  Hgb 11.6  Hct 36.9  Plt 229  TVUS (06/25/2021): Uterus 13.89 x 12.61 x 11.26cm Endometrial thickness 0.45cm Fibroi 1: 9.17cm Right ovary 2.91cm  Left ovary 3.16cm Anteverted uterus. Uterine fibroid -  posterior submucosal 9.2 x 8.8 x 8.0cm. Endometrium wnl. IUD seen mid to lower uterine segment. Bilateral ovaries wnl. No adnexal masses seen.  Patient Active Problem List   Diagnosis Date Noted   Abnormal uterine bleeding (AUB) 12/02/2021   Essential hypertension 11/23/2015   Fibroids 11/23/2015   Uncontrolled type 2 diabetes mellitus with hyperglycemia ([place]) 12/27/2014   Type II or unspecified type diabetes mellitus without mention of complication, not stated as uncontrolled 08/25/2012    MEDICAL/FAMILY/SOCIAL HX: Patient's last menstrual period was 11/15/2021 (approximate).    Past Medical History:  Diagnosis Date   Anemia 11/28/2021   due to heavy bleeding from fibroids   Anxiety    has not seen a doctor   COVID-19    03/28/2019 possible pneumonia, ED visit prescribed antibiotics, also had Covid on  09/03/2019, 09/05/2020   Diabetes mellitus without complication ([place])    Type 2, follow with Dr. Gardiner Coins, LOV w/in the last 6 months per pt on 11/26/21.   Fibroids    uterine   Hypertension    Wears glasses     Past Surgical History:  Procedure Laterality Date   CHOLECYSTECTOMY  2015   UTERINE ARTERY EMBOLIZATION  2020    Family History  Problem Relation Age of Onset   Diabetes Father     Social History:  reports that she has never smoked. She has never used smokeless tobacco. She reports that she does not drink alcohol and does not use drugs.  ALLERGIES/MEDS:  Allergies:  Allergies  Allergen Reactions   Doxycycline     Felt weird and throat itchy.   Milk ([place])     Dairy products cause  GI upset.    No medications prior to admission.     Review of Systems  Constitutional: Negative.   HENT: Negative.    Eyes: Negative.   Respiratory: Negative.    Cardiovascular: Negative.   Gastrointestinal: Negative.   Genitourinary: Negative.   Musculoskeletal: Negative.   Skin: Negative.   Neurological: Negative.   Endo/Heme/Allergies: Negative.    Psychiatric/Behavioral: Negative.      Height '5\' 8"'$  (1.727 m), weight 124.7 kg, last menstrual period 11/15/2021. Gen:  NAD, pleasant and cooperative Cardio:  RRR Pulm:  CTAB, no wheezes/rales/rhonchi Abd:  Soft, non-distended, non-tender throughout, no rebound/guarding Ext:  No bilateral LE edema, no bilateral calf tenderness Pelvic: Labia - unremarkable, vagina - pink moist mucosa, no lesions or abnormal discharge, cervix - no discharge or lesions or CMT, adnexa - no masses or tenderness - uterus non-tender and mildly enlarged in size on palpation   No results found for this or any previous visit (from the past 24 hour(s)).  No results found.   ASSESSMENT/PLAN: Joan Schmidt is a 41 y.o. G4P3 who is admitted for Robotic Assisted Total Laparoscopic Hysterectomy with bilateral salpingectomy for abnormal uterine bleeding.  - Admit to [facility] - Admit labs (CBC, T&S, COVID screen per protocol) - Diet:  Per anesthesia - IVF:  Per anesthesia - VTE Prophylaxis:  SCDs - Antibiotics: Ancef 3g on call to OR - Anticipate D/C home POD#1  Consents: I have explained to the patient that his surgery is performed to remove the uterus through several small incisions in the abdomen and that it will result in sterility.  I discussed the risks and benefits of the surgery, including, but not limited to bleeding, including the need for blood transfusion, infection, damage to surround organs and tissues, damage to bladder, damage to ureters, causing kidney damage, and requiring additional procedures, damage to bowels, resulting in further surgery, postoperative pain, short-term and long-term, scarring on the abdominal wall and intra-abdominally, need for further surgery, need for conversion to an open procedure, development of an incisional hernia, deep vein thrombosis, and/or pulmonary embolism, wound infection and/or separation, painful intercourse, urinary leakage, ovarian failure, resulting in menopausal  symptoms requiring treatment, fistula formation, complications the course of which cannot be predicted or prevented, and death. Patient desires ovarian preservation. She understands risk of conversion to an open procedure. Patient understands that if cancer is found at the time of procedure or with final pathology that further procedures may be required. Patient strongly declines any uterine morcellation at the time of procedure. Patient was consented for blood products.  The patient is aware that bleeding may result in the need for a blood transfusion which includes risk of transmission of HIV (1:2 million), Hepatitis C (1:2 million), and Hepatitis B (1:200 thousand) and transfusion reaction.  Patient voiced understanding of the above risks as well as understanding of indications for blood transfusion.  Drema Dallas, DO

## 2021-11-28 ENCOUNTER — Other Ambulatory Visit: Payer: Self-pay

## 2021-11-28 ENCOUNTER — Encounter (HOSPITAL_BASED_OUTPATIENT_CLINIC_OR_DEPARTMENT_OTHER): Payer: Self-pay | Admitting: Obstetrics and Gynecology

## 2021-11-28 DIAGNOSIS — D649 Anemia, unspecified: Secondary | ICD-10-CM

## 2021-11-28 HISTORY — DX: Anemia, unspecified: D64.9

## 2021-11-28 NOTE — Progress Notes (Addendum)
Spoke w/ via phone for pre-op interview---Joan Schmidt needs dos---- urine pregnancy per anesthesia, surgeon orders pending as of 11/28/21              Schmidt results------12/10/21 Schmidt appt for cbc, type & screen, bmp, ekg, 12/13/20 chest xray in Care everywhere COVID test -----patient states asymptomatic no test needed Arrive at -------1115 on Wednesday, 12/12/21 NPO after MN NO Solid Food.  Clear liquids from MN until---1015 Med rec completed Medications to take morning of surgery -----NONE Diabetic medication -----Patient instructed to call Dr. Janese Banks, endocronologist for instructions on taking Humulin R 500 insulin. Patient instructed to hold Ozempic on the Sunday before surgery. Patient instructed no nail polish to be worn day of surgery Patient instructed to bring photo id and insurance card day of surgery Patient aware to have Driver (ride ) / caregiver    for 24 hours after surgery - husband, Joan Schmidt Patient Special Instructions -----Extended / Overnight stay instructions given. Patient instructed to call me and let me know the instructions Dr. Janese Banks gives her for taking Humulin 500 insulin. Pre-Op special Istructions -----Requested orders from Dr. Landry Mellow via [place] on 11/26/21. Patient verbalized understanding of instructions that were given at this phone interview. Patient denies shortness of breath, chest pain, fever, cough at this phone interview.   As of 11/28/21, pt stated that blood glucose monitor will be located on right upper arm on the day of surgery. Addendum: 12/04/21 Patient stated that she had contacted Dr. Janese Banks concerning her Humulin R 500 insulin for surgery. She stated that Dr. Janese Banks instructed her to take 1/2 the dose on the day of surgery. She stated that she was going to get in touch with her again because she didn't think she should take any since she wouldn't be eating. She will call us back to update. Patient also said that she was not going to be wearing her glucose monitor on the day of  surgery. Addendum: 12/10/21 Patient called and stated that she had spoken with Dr. Janese Banks and confirmed that she is suppose to take 1/2 dose (10 units) of Humulin R 500 on the morning of surgery.

## 2021-11-28 NOTE — Progress Notes (Signed)
Your procedure is scheduled on Wednesday, 12/12/21  Report to [facility]. M.   Call this number if you have problems the morning of surgery  [phone].   OUR ADDRESS IS [address].  WE ARE LOCATED IN [facility].  PLEASE BRING YOUR INSURANCE CARD AND PHOTO ID DAY OF SURGERY.  ONLY 2 PEOPLE ARE ALLOWED IN  WAITING  ROOM.                                      REMEMBER:  DO NOT EAT FOOD, CANDY GUM OR MINTS  AFTER MIDNIGHT THE NIGHT BEFORE YOUR SURGERY . YOU MAY HAVE CLEAR LIQUIDS FROM MIDNIGHT THE NIGHT BEFORE YOUR SURGERY UNTIL  10:15 AM. NO CLEAR LIQUIDS AFTER   10:15 AM DAY OF SURGERY.  YOU MAY  BRUSH YOUR TEETH MORNING OF SURGERY AND RINSE YOUR MOUTH OUT, NO CHEWING GUM CANDY OR MINTS.     CLEAR LIQUID DIET   Foods Allowed                                                                     Foods Excluded  Coffee and tea, regular and decaf                             liquids that you cannot  Plain Jell-O                                                                   see through such as: Fruit ices (not with fruit pulp)                                     milk, soups, orange juice  Plain  Popsicles                                    All solid food Carbonated beverages, regular and diet                                    Cranberry, grape and apple juices Sports drinks like Gatorade _____________________________________________________________________     TAKE THESE MEDICATIONS MORNING OF SURGERY: NONE  DO NOT TAKE OZEMPIC ON THE SUNDAY BEFORE SURGERY.  IF YOU HAVE NOT ALREADY, PLEASE CALL Irine Heminger AT [phone] WITH INSTRUCTIONS GIVEN TO YOUR FROM DR. RAO ON TAKING HUMULIN 500 INSULIN.    UP TO 4 VISITORS  MAY VISIT IN THE EXTENDED RECOVERY ROOM UNTIL 800 PM ONLY.  ONE  VISITOR AGE 3 AND OVER MAY SPEND THE NIGHT AND MUST BE IN EXTENDED RECOVERY ROOM NO LATER THAN  800 PM . YOUR DISCHARGE TIME AFTER YOU SPEND THE  NIGHT IS 900 AM THE MORNING AFTER YOUR SURGERY.  YOU MAY PACK A SMALL OVERNIGHT BAG WITH TOILETRIES FOR YOUR OVERNIGHT STAY IF YOU WISH.  YOUR PRESCRIPTION MEDICATIONS WILL BE PROVIDED DURING [place].                                      DO NOT WEAR JEWERLY, MAKE UP. DO NOT WEAR LOTIONS, POWDERS, PERFUMES OR NAIL POLISH ON YOUR FINGERNAILS. TOENAIL POLISH IS OK TO WEAR. DO NOT SHAVE FOR 48 HOURS PRIOR TO DAY OF SURGERY. MEN MAY SHAVE FACE AND NECK. CONTACTS, GLASSES, OR DENTURES MAY NOT BE WORN TO SURGERY.  REMEMBER: NO SMOKING, DRUGS OR ALCOHOL FOR 24 HOURS BEFORE YOUR SURGERY.                                    [place] IS NOT RESPONSIBLE  FOR ANY BELONGINGS.                                                                    Marland Kitchen            - Preparing for Surgery Before surgery, you can play an important role.  Because skin is not sterile, your skin needs to be as free of germs as possible.  You can reduce the number of germs on your skin by washing with CHG (chlorahexidine gluconate) soap before surgery.  CHG is an antiseptic cleaner which kills germs and bonds with the skin to continue killing germs even after washing. Please DO NOT use if you have an allergy to CHG or antibacterial soaps.  If your skin becomes reddened/irritated stop using the CHG and inform your nurse when you arrive at Short Stay. Do not shave (including legs and underarms) for at least 48 hours prior to the first CHG shower.  You may shave your face/neck. Please follow these instructions carefully:  1.  Shower with CHG Soap the night before surgery and the  morning of Surgery.  2.  If you choose to wash your hair, wash your hair first as usual with your  normal  shampoo.  3.  After you shampoo, rinse your hair and body thoroughly to remove the  shampoo.                            4.  Use CHG as you would any other liquid soap.  You can apply chg directly  to the skin and wash , please wash  your belly button thoroughly with chg soap provided night before and morning of your surgery.                     Gently with a scrungie or clean washcloth.  5.  Apply the CHG Soap to your body ONLY FROM THE NECK DOWN.   Do not use on face/ open  Wound or open sores. Avoid contact with eyes, ears mouth and genitals (private parts).                       Wash face,  Genitals (private parts) with your normal soap.             6.  Wash thoroughly, paying special attention to the area where your surgery  will be performed.  7.  Thoroughly rinse your body with warm water from the neck down.  8.  DO NOT shower/wash with your normal soap after using and rinsing off  the CHG Soap.                9.  Pat yourself dry with a clean towel.            10.  Wear clean pajamas.            11.  Place clean sheets on your bed the night of your first shower and do not  sleep with pets. Day of Surgery : Do not apply any lotions/deodorants the morning of surgery.  Please wear clean clothes to the hospital/surgery center.  IF YOU HAVE ANY SKIN IRRITATION OR PROBLEMS WITH THE SURGICAL SOAP, PLEASE GET A BAR OF GOLD DIAL SOAP AND SHOWER THE NIGHT BEFORE YOUR SURGERY AND THE MORNING OF YOUR SURGERY. PLEASE LET THE NURSE KNOW MORNING OF YOUR SURGERY IF YOU HAD ANY PROBLEMS WITH THE SURGICAL SOAP.   ________________________________________________________________________                                                        QUESTIONS [place] PRE OP NURSE PHONE [phone].

## 2021-12-02 ENCOUNTER — Encounter (HOSPITAL_BASED_OUTPATIENT_CLINIC_OR_DEPARTMENT_OTHER): Payer: Self-pay | Admitting: Obstetrics and Gynecology

## 2021-12-02 DIAGNOSIS — N939 Abnormal uterine and vaginal bleeding, unspecified: Secondary | ICD-10-CM

## 2021-12-10 ENCOUNTER — Encounter (HOSPITAL_COMMUNITY)
Admission: RE | Admit: 2021-12-10 | Discharge: 2021-12-10 | Disposition: A | Discharge status: Home / Self Care | Payer: Managed Care, Other (non HMO) | Source: Ambulatory Visit | Attending: Obstetrics and Gynecology | Admitting: Obstetrics and Gynecology

## 2021-12-10 DIAGNOSIS — Z01818 Encounter for other preprocedural examination: Secondary | ICD-10-CM | POA: Diagnosis present

## 2021-12-10 DIAGNOSIS — N939 Abnormal uterine and vaginal bleeding, unspecified: Secondary | ICD-10-CM | POA: Diagnosis not present

## 2021-12-10 LAB — BASIC METABOLIC PANEL
Anion gap: 8 (ref 5–15)
BUN: 8 mg/dL (ref 6–20)
CO2: 24 mmol/L (ref 22–32)
Calcium: 9.4 mg/dL (ref 8.9–10.3)
Chloride: 104 mmol/L (ref 98–111)
Creatinine, Ser: 0.64 mg/dL (ref 0.44–1.00)
GFR, Estimated: >60 mL/min (ref >60)
Glucose, Bld: 168 mg/dL — ABNORMAL HIGH (ref 70–99)
Potassium: 3.8 mmol/L (ref 3.5–5.1)
Sodium: 136 mmol/L (ref 135–145)

## 2021-12-10 LAB — CBC
HCT: 39.8 % (ref 36.0–46.0)
Hemoglobin: 11.9 g/dL — ABNORMAL LOW (ref 12.0–15.0)
MCH: 21.2 pg — ABNORMAL LOW (ref 26.0–34.0)
MCHC: 29.9 g/dL — ABNORMAL LOW (ref 30.0–36.0)
MCV: 70.8 fL — ABNORMAL LOW (ref 80.0–100.0)
Platelets: 293 10*3/uL (ref 150–400)
RBC: 5.62 MIL/uL — ABNORMAL HIGH (ref 3.87–5.11)
RDW: 17.7 % — ABNORMAL HIGH (ref 11.5–15.5)
WBC: 6.6 10*3/uL (ref 4.0–10.5)
nRBC: 0 % (ref 0.0–0.2)

## 2021-12-11 NOTE — Progress Notes (Signed)
Called patient to update arrival time for procedure tomorrow, 12/12/21. Advised patient to arrive at 1000 for 1200 surgery start time. Patient denies questions at this time.

## 2021-12-12 ENCOUNTER — Encounter (HOSPITAL_BASED_OUTPATIENT_CLINIC_OR_DEPARTMENT_OTHER)
Admission: RE | Disposition: A | Discharge status: Home / Self Care | Payer: Self-pay | Source: Home / Self Care | Attending: Obstetrics and Gynecology

## 2021-12-12 ENCOUNTER — Ambulatory Visit (HOSPITAL_BASED_OUTPATIENT_CLINIC_OR_DEPARTMENT_OTHER): Payer: Managed Care, Other (non HMO) | Admitting: Certified Registered"

## 2021-12-12 ENCOUNTER — Encounter (HOSPITAL_BASED_OUTPATIENT_CLINIC_OR_DEPARTMENT_OTHER): Payer: Self-pay | Admitting: Obstetrics and Gynecology

## 2021-12-12 ENCOUNTER — Other Ambulatory Visit: Payer: Self-pay

## 2021-12-12 ENCOUNTER — Ambulatory Visit (HOSPITAL_BASED_OUTPATIENT_CLINIC_OR_DEPARTMENT_OTHER)
Admission: RE | Admit: 2021-12-12 | Discharge: 2021-12-13 | Disposition: A | Discharge status: Home / Self Care | Payer: Managed Care, Other (non HMO) | Attending: Obstetrics and Gynecology | Admitting: Obstetrics and Gynecology

## 2021-12-12 DIAGNOSIS — D259 Leiomyoma of uterus, unspecified: Secondary | ICD-10-CM | POA: Insufficient documentation

## 2021-12-12 DIAGNOSIS — I10 Essential (primary) hypertension: Secondary | ICD-10-CM | POA: Insufficient documentation

## 2021-12-12 DIAGNOSIS — Z833 Family history of diabetes mellitus: Secondary | ICD-10-CM | POA: Diagnosis not present

## 2021-12-12 DIAGNOSIS — Z6841 Body Mass Index (BMI) 40.0 and over, adult: Secondary | ICD-10-CM | POA: Diagnosis not present

## 2021-12-12 DIAGNOSIS — Z7985 Long-term (current) use of injectable non-insulin antidiabetic drugs: Secondary | ICD-10-CM | POA: Diagnosis not present

## 2021-12-12 DIAGNOSIS — N939 Abnormal uterine and vaginal bleeding, unspecified: Secondary | ICD-10-CM

## 2021-12-12 DIAGNOSIS — E119 Type 2 diabetes mellitus without complications: Secondary | ICD-10-CM | POA: Insufficient documentation

## 2021-12-12 DIAGNOSIS — Z01818 Encounter for other preprocedural examination: Secondary | ICD-10-CM

## 2021-12-12 DIAGNOSIS — Z79899 Other long term (current) drug therapy: Secondary | ICD-10-CM | POA: Insufficient documentation

## 2021-12-12 DIAGNOSIS — Z794 Long term (current) use of insulin: Secondary | ICD-10-CM | POA: Insufficient documentation

## 2021-12-12 DIAGNOSIS — Z793 Long term (current) use of hormonal contraceptives: Secondary | ICD-10-CM | POA: Diagnosis not present

## 2021-12-12 HISTORY — DX: Anxiety disorder, unspecified: F41.9

## 2021-12-12 HISTORY — PX: ROBOTIC ASSISTED LAPAROSCOPIC HYSTERECTOMY AND SALPINGECTOMY: SHX6379

## 2021-12-12 HISTORY — DX: Presence of spectacles and contact lenses: Z97.3

## 2021-12-12 HISTORY — DX: COVID-19: U07.1

## 2021-12-12 LAB — GLUCOSE, CAPILLARY
Glucose-Capillary: 192 mg/dL — ABNORMAL HIGH (ref 70–99)
Glucose-Capillary: 153 mg/dL — ABNORMAL HIGH (ref 70–99)
Glucose-Capillary: 276 mg/dL — ABNORMAL HIGH (ref 70–99)
Glucose-Capillary: 302 mg/dL — ABNORMAL HIGH (ref 70–99)
Glucose-Capillary: 337 mg/dL — ABNORMAL HIGH (ref 70–99)

## 2021-12-12 LAB — TYPE AND SCREEN
ABO/RH(D): AB POS
Antibody Screen: NEGATIVE

## 2021-12-12 LAB — POCT PREGNANCY, URINE: Preg Test, Ur: NEGATIVE

## 2021-12-12 SURGERY — XI ROBOTIC ASSISTED LAPAROSCOPIC HYSTERECTOMY AND SALPINGECTOMY
Anesthesia: General | Site: Abdomen

## 2021-12-12 MED ORDER — PHENYLEPHRINE 80 MCG/ML (10ML) SYRINGE FOR IV PUSH (FOR BLOOD PRESSURE SUPPORT)
PREFILLED_SYRINGE | INTRAVENOUS | Status: AC
  Filled 2021-12-12: qty 10

## 2021-12-12 MED ORDER — OXYCODONE HCL 5 MG PO TABS
ORAL_TABLET | ORAL | Status: AC
  Filled 2021-12-12: qty 1

## 2021-12-12 MED ORDER — OXYCODONE HCL 5 MG/5ML PO SOLN: 5.0000 mg | Freq: Once | ORAL | Status: DC | PRN

## 2021-12-12 MED ORDER — KETOROLAC TROMETHAMINE 30 MG/ML IJ SOLN
INTRAMUSCULAR | Status: DC | PRN
  Administered 2021-12-12: 30 mg via INTRAVENOUS

## 2021-12-12 MED ORDER — FENTANYL CITRATE (PF) 100 MCG/2ML IJ SOLN
25.0000 ug | INTRAMUSCULAR | Status: DC | PRN
  Administered 2021-12-12: 50 ug via INTRAVENOUS
  Administered 2021-12-12 (×2): 25 ug via INTRAVENOUS

## 2021-12-12 MED ORDER — DOCUSATE SODIUM 100 MG PO CAPS
ORAL_CAPSULE | ORAL | Status: AC
  Filled 2021-12-12: qty 1

## 2021-12-12 MED ORDER — ONDANSETRON HCL 4 MG/2ML IJ SOLN: 4.0000 mg | Freq: Four times a day (QID) | INTRAMUSCULAR | Status: DC | PRN

## 2021-12-12 MED ORDER — HYDROCHLOROTHIAZIDE 25 MG PO TABS
25.0000 mg | ORAL_TABLET | Freq: Every day | ORAL | Status: DC
  Administered 2021-12-12: 25 mg via ORAL
  Filled 2021-12-12: qty 1

## 2021-12-12 MED ORDER — DEXMEDETOMIDINE HCL IN NACL 200 MCG/50ML IV SOLN
INTRAVENOUS | Status: DC | PRN
  Administered 2021-12-12: 4 ug via INTRAVENOUS
  Administered 2021-12-12 (×2): 8 ug via INTRAVENOUS

## 2021-12-12 MED ORDER — CEFAZOLIN SODIUM-DEXTROSE 2-4 GM/100ML-% IV SOLN
INTRAVENOUS | Status: AC
  Filled 2021-12-12: qty 100

## 2021-12-12 MED ORDER — PHENYLEPHRINE HCL (PRESSORS) 10 MG/ML IV SOLN
INTRAVENOUS | Status: DC | PRN
  Administered 2021-12-12: 160 ug via INTRAVENOUS
  Administered 2021-12-12: 180 ug via INTRAVENOUS
  Administered 2021-12-12: 80 ug via INTRAVENOUS

## 2021-12-12 MED ORDER — SUGAMMADEX SODIUM 200 MG/2ML IV SOLN
INTRAVENOUS | Status: DC | PRN
  Administered 2021-12-12: 250 mg via INTRAVENOUS

## 2021-12-12 MED ORDER — SODIUM CHLORIDE 0.9 % IV SOLN
INTRAVENOUS | Status: DC | PRN
  Administered 2021-12-12: 110 mL

## 2021-12-12 MED ORDER — LOSARTAN POTASSIUM-HCTZ 100-25 MG PO TABS: 1.0000 | ORAL_TABLET | Freq: Every day | ORAL | Status: DC

## 2021-12-12 MED ORDER — FENTANYL CITRATE (PF) 100 MCG/2ML IJ SOLN
INTRAMUSCULAR | Status: AC
  Filled 2021-12-12: qty 2

## 2021-12-12 MED ORDER — LABETALOL HCL 5 MG/ML IV SOLN
INTRAVENOUS | Status: DC | PRN
  Administered 2021-12-12 (×3): 5 mg via INTRAVENOUS

## 2021-12-12 MED ORDER — INSULIN ASPART 100 UNIT/ML IJ SOLN
INTRAMUSCULAR | Status: AC
  Filled 2021-12-12: qty 1

## 2021-12-12 MED ORDER — FENTANYL CITRATE (PF) 100 MCG/2ML IJ SOLN
INTRAMUSCULAR | Status: DC | PRN
  Administered 2021-12-12: 25 ug via INTRAVENOUS
  Administered 2021-12-12: 100 ug via INTRAVENOUS
  Administered 2021-12-12 (×3): 50 ug via INTRAVENOUS
  Administered 2021-12-12 (×2): 25 ug via INTRAVENOUS
  Administered 2021-12-12: 50 ug via INTRAVENOUS
  Administered 2021-12-12: 25 ug via INTRAVENOUS
  Administered 2021-12-12: 50 ug via INTRAVENOUS

## 2021-12-12 MED ORDER — INSULIN ASPART 100 UNIT/ML IJ SOLN: 0.0000 [IU] | Freq: Three times a day (TID) | INTRAMUSCULAR | Status: DC

## 2021-12-12 MED ORDER — IBUPROFEN 200 MG PO TABS
600.0000 mg | ORAL_TABLET | Freq: Four times a day (QID) | ORAL | Status: DC
  Administered 2021-12-12 – 2021-12-13 (×2): 600 mg via ORAL

## 2021-12-12 MED ORDER — CEFAZOLIN IN SODIUM CHLORIDE 3-0.9 GM/100ML-% IV SOLN
INTRAVENOUS | Status: AC
  Filled 2021-12-12: qty 100

## 2021-12-12 MED ORDER — ONDANSETRON HCL 4 MG PO TABS
4.0000 mg | ORAL_TABLET | Freq: Four times a day (QID) | ORAL | Status: DC | PRN
  Administered 2021-12-12: 4 mg via ORAL

## 2021-12-12 MED ORDER — DEXAMETHASONE SODIUM PHOSPHATE 4 MG/ML IJ SOLN
INTRAMUSCULAR | Status: DC | PRN
  Administered 2021-12-12: 4 mg via INTRAVENOUS

## 2021-12-12 MED ORDER — PROPOFOL 10 MG/ML IV BOLUS
INTRAVENOUS | Status: AC
  Filled 2021-12-12: qty 20

## 2021-12-12 MED ORDER — MIDAZOLAM HCL 2 MG/2ML IJ SOLN
INTRAMUSCULAR | Status: AC
  Filled 2021-12-12: qty 2

## 2021-12-12 MED ORDER — LOSARTAN POTASSIUM 50 MG PO TABS
100.0000 mg | ORAL_TABLET | Freq: Every day | ORAL | Status: DC
  Administered 2021-12-12: 100 mg via ORAL
  Filled 2021-12-12: qty 2

## 2021-12-12 MED ORDER — CEFAZOLIN IN SODIUM CHLORIDE 3-0.9 GM/100ML-% IV SOLN
3.0000 g | INTRAVENOUS | Status: AC
  Administered 2021-12-12: 3 g via INTRAVENOUS

## 2021-12-12 MED ORDER — ONDANSETRON HCL 4 MG/2ML IJ SOLN
INTRAMUSCULAR | Status: DC | PRN
  Administered 2021-12-12: 4 mg via INTRAVENOUS

## 2021-12-12 MED ORDER — OXYCODONE HCL 5 MG PO TABS
5.0000 mg | ORAL_TABLET | ORAL | Status: DC | PRN
  Administered 2021-12-12 – 2021-12-13 (×4): 5 mg via ORAL

## 2021-12-12 MED ORDER — INSULIN ASPART 100 UNIT/ML IJ SOLN
8.0000 [IU] | Freq: Once | INTRAMUSCULAR | Status: AC
  Administered 2021-12-12: 8 [IU] via SUBCUTANEOUS

## 2021-12-12 MED ORDER — SODIUM CHLORIDE 0.9 % IR SOLN
Status: DC | PRN
  Administered 2021-12-12: 1000 mL

## 2021-12-12 MED ORDER — ACETAMINOPHEN 10 MG/ML IV SOLN
INTRAVENOUS | Status: AC
  Filled 2021-12-12: qty 100

## 2021-12-12 MED ORDER — MIDAZOLAM HCL 2 MG/2ML IJ SOLN
INTRAMUSCULAR | Status: DC | PRN
  Administered 2021-12-12: 2 mg via INTRAVENOUS

## 2021-12-12 MED ORDER — LACTATED RINGERS IV SOLN: INTRAVENOUS | Status: DC

## 2021-12-12 MED ORDER — INSULIN REGULAR HUMAN (CONC) 500 UNIT/ML ~~LOC~~ SOLN
20.0000 [IU] | Freq: Two times a day (BID) | SUBCUTANEOUS | Status: DC
  Administered 2021-12-13: 10 [IU] via SUBCUTANEOUS

## 2021-12-12 MED ORDER — ROCURONIUM BROMIDE 10 MG/ML (PF) SYRINGE
PREFILLED_SYRINGE | INTRAVENOUS | Status: AC
  Filled 2021-12-12: qty 10

## 2021-12-12 MED ORDER — ROCURONIUM BROMIDE 100 MG/10ML IV SOLN
INTRAVENOUS | Status: DC | PRN
  Administered 2021-12-12: 20 mg via INTRAVENOUS
  Administered 2021-12-12: 60 mg via INTRAVENOUS
  Administered 2021-12-12: 10 mg via INTRAVENOUS

## 2021-12-12 MED ORDER — IBUPROFEN 200 MG PO TABS
ORAL_TABLET | ORAL | Status: AC
  Filled 2021-12-12: qty 3

## 2021-12-12 MED ORDER — OXYCODONE HCL 5 MG PO TABS: 5.0000 mg | ORAL_TABLET | Freq: Once | ORAL | Status: DC | PRN

## 2021-12-12 MED ORDER — SIMETHICONE 80 MG PO CHEW: 80.0000 mg | CHEWABLE_TABLET | Freq: Four times a day (QID) | ORAL | Status: DC | PRN

## 2021-12-12 MED ORDER — DOCUSATE SODIUM 100 MG PO CAPS
100.0000 mg | ORAL_CAPSULE | Freq: Two times a day (BID) | ORAL | Status: DC
  Administered 2021-12-12: 100 mg via ORAL

## 2021-12-12 MED ORDER — PROPOFOL 10 MG/ML IV BOLUS
INTRAVENOUS | Status: DC | PRN
  Administered 2021-12-12: 150 mg via INTRAVENOUS
  Administered 2021-12-12: 50 mg via INTRAVENOUS
  Administered 2021-12-12: 30 mg via INTRAVENOUS

## 2021-12-12 MED ORDER — MORPHINE SULFATE (PF) 4 MG/ML IV SOLN: 1.0000 mg | INTRAVENOUS | Status: DC | PRN

## 2021-12-12 MED ORDER — PROPOFOL 500 MG/50ML IV EMUL
INTRAVENOUS | Status: AC
  Filled 2021-12-12: qty 50

## 2021-12-12 MED ORDER — FENTANYL CITRATE (PF) 250 MCG/5ML IJ SOLN
INTRAMUSCULAR | Status: AC
  Filled 2021-12-12: qty 5

## 2021-12-12 MED ORDER — ACETAMINOPHEN 500 MG PO TABS
ORAL_TABLET | ORAL | Status: AC
  Filled 2021-12-12: qty 2

## 2021-12-12 MED ORDER — ACETAMINOPHEN 500 MG PO TABS
1000.0000 mg | ORAL_TABLET | Freq: Four times a day (QID) | ORAL | Status: DC
  Administered 2021-12-12 – 2021-12-13 (×3): 1000 mg via ORAL

## 2021-12-12 MED ORDER — ACETAMINOPHEN 10 MG/ML IV SOLN
INTRAVENOUS | Status: DC | PRN
  Administered 2021-12-12: 1000 mg via INTRAVENOUS

## 2021-12-12 MED ORDER — HEMOSTATIC AGENTS (NO CHARGE) OPTIME
TOPICAL | Status: DC | PRN
  Administered 2021-12-12: 1 via TOPICAL

## 2021-12-12 MED ORDER — ONDANSETRON 4 MG PO TBDP
ORAL_TABLET | ORAL | Status: AC
  Filled 2021-12-12: qty 1

## 2021-12-12 SURGICAL SUPPLY — 59 items
ADH SKN CLS APL DERMABOND .7 (GAUZE/BANDAGES/DRESSINGS) ×2
APL ESCP 73.6OZ SRGCL (TIP) ×2
CATH FOLEY 3WAY  5CC 16FR (CATHETERS) ×2
CATH FOLEY 3WAY 5CC 16FR (CATHETERS) ×2 IMPLANT
COVER BACK TABLE 60X90IN (DRAPES) ×2 IMPLANT
COVER TIP SHEARS 8 DVNC (MISCELLANEOUS) ×2 IMPLANT
COVER TIP SHEARS 8MM DA VINCI (MISCELLANEOUS) ×2
DEFOGGER SCOPE WARMER CLEARIFY (MISCELLANEOUS) ×2 IMPLANT
DERMABOND ADVANCED .7 DNX12 (GAUZE/BANDAGES/DRESSINGS) ×2 IMPLANT
DILATOR CANAL MILEX (MISCELLANEOUS) ×2 IMPLANT
DRAPE ARM DVNC X/XI (DISPOSABLE) ×8 IMPLANT
DRAPE COLUMN DVNC XI (DISPOSABLE) ×2 IMPLANT
DRAPE DA VINCI XI ARM (DISPOSABLE) ×8
DRAPE DA VINCI XI COLUMN (DISPOSABLE) ×2
DRAPE INCISE 23X17 IOBAN STRL (DRAPES) ×2
DRAPE INCISE 23X17 STRL (DRAPES) IMPLANT
DRAPE INCISE IOBAN 23X17 STRL (DRAPES) ×2 IMPLANT
DRAPE UTILITY 15X26 TOWEL STRL (DRAPES) ×2 IMPLANT
DRAPE UTILITY XL STRL (DRAPES) ×2 IMPLANT
DURAPREP 26ML APPLICATOR (WOUND CARE) ×2 IMPLANT
ELECT REM PT RETURN 9FT ADLT (ELECTROSURGICAL) ×2
ELECTRODE REM PT RTRN 9FT ADLT (ELECTROSURGICAL) ×2 IMPLANT
GLOVE BIOGEL M 6.5 STRL (GLOVE) ×6 IMPLANT
GLOVE BIOGEL PI IND STRL 6.5 (GLOVE) ×12 IMPLANT
GLOVE BIOGEL PI IND STRL 7.0 (GLOVE) ×12 IMPLANT
HEMOSTAT ARISTA ABSORB 3G PWDR (HEMOSTASIS) ×1 IMPLANT
IRRIG SUCT STRYKERFLOW 2 WTIP (MISCELLANEOUS) ×2
IRRIGATION SUCT STRKRFLW 2 WTP (MISCELLANEOUS) ×2 IMPLANT
KIT TURNOVER CYSTO (KITS) ×2 IMPLANT
LEGGING LITHOTOMY PAIR STRL (DRAPES) ×2 IMPLANT
OBTURATOR OPTICAL STANDARD 8MM (TROCAR) ×2
OBTURATOR OPTICAL STND 8 DVNC (TROCAR) ×2
OBTURATOR OPTICALSTD 8 DVNC (TROCAR) ×1 IMPLANT
OCCLUDER COLPOPNEUMO (BALLOONS) ×2 IMPLANT
PACK ROBOT WH (CUSTOM PROCEDURE TRAY) ×2 IMPLANT
PACK ROBOTIC GOWN (GOWN DISPOSABLE) ×2 IMPLANT
PACK TRENDGUARD 450 HYBRID PRO (MISCELLANEOUS) ×1 IMPLANT
PAD OB MATERNITY 4.3X12.25 (PERSONAL CARE ITEMS) ×2 IMPLANT
PAD PREP 24X48 CUFFED NSTRL (MISCELLANEOUS) ×2 IMPLANT
POWDER SURGICEL 3.0 GRAM (HEMOSTASIS) ×1 IMPLANT
SCISSORS LAP 5X45 EPIX DISP (ENDOMECHANICALS) ×1 IMPLANT
SEAL CANN UNIV 5-8 DVNC XI (MISCELLANEOUS) ×8 IMPLANT
SEAL XI 5MM-8MM UNIVERSAL (MISCELLANEOUS) ×8
SEALER VESSEL DA VINCI XI (MISCELLANEOUS) ×2
SEALER VESSEL EXT DVNC XI (MISCELLANEOUS) ×1 IMPLANT
SET TRI-LUMEN FLTR TB AIRSEAL (TUBING) ×1 IMPLANT
SUT VIC AB 0 CT1 27 (SUTURE) ×4
SUT VIC AB 0 CT1 27XBRD ANBCTR (SUTURE) ×4 IMPLANT
SUT VIC AB 3-0 CT1 27 (SUTURE) ×2
SUT VIC AB 3-0 CT1 TAPERPNT 27 (SUTURE) ×1 IMPLANT
SUT VICRYL RAPIDE 4/0 PS 2 (SUTURE) ×5 IMPLANT
SUT VLOC 180 0 9IN  GS21 (SUTURE) ×2
SUT VLOC 180 0 9IN GS21 (SUTURE) ×2 IMPLANT
TIP ENDOSCOPIC SURGICEL (TIP) ×1 IMPLANT
TIP RUMI ORANGE 6.7MMX12CM (TIP) ×1 IMPLANT
TOWEL OR 17X26 10 PK STRL BLUE (TOWEL DISPOSABLE) ×2 IMPLANT
TRENDGUARD 450 HYBRID PRO PACK (MISCELLANEOUS) ×2
TROCAR PORT AIRSEAL 8X120 (TROCAR) ×2 IMPLANT
WATER STERILE IRR 1000ML POUR (IV SOLUTION) ×2 IMPLANT

## 2021-12-12 NOTE — Op Note (Addendum)
Pre Op Dx:   1. Abnormal uterine bleeding 2. Enlarged, fibroid uterus 3. History of uterine artery embolization 4. Failed medical management of AUB  Post Op Dx:   Same as pre-operative diagnoses  Procedure:   Robotic Assisted Total Laparoscopic Hysterectomy with Bilateral Salpingectomy   Surgeon:  Dr. Drema Dallas Assistants:  Dr. Christophe Louis (assistant needed due to the complexity of the anatomy) Anesthesia:  General   EBL:  200cc  IVF:  1100cc UOP:  350cc   Drains:  Foley catheter Specimen removed:  Uterus, cervix, and bilateral fallopian tubes -- sent to pathology Device(s) implanted: None Case Type:  Clean-contaminated Findings:  Enlarged ~14 week sized uterus, normal-appearing bilateral fallopian tubes and ovaries. Normal-appearing liver contours. Bilateral ureters visualized after hysterectomy with peristalsis of each noted. Complications: None  Indications:  41 y.o. G4P3 with AUB-L, history of [place], and failed medical management of AUB who desires definitive surgical management.  Description of each procedure:  After informed consent the patient was taken to the operating room and placed in dorsal supine position where general endotracheal anesthesia was administered and found to be adequate.  She was placed in dorsal lithotomy position with her arms tucked.  She was prepped and draped in the usual sterile fashion. A timeout was called and the procedure confirmed. A RUMI uterine manipulator with the Koh cup and a Foley catheter were placed.     A 77m supraumbilical incision was made and a trochar was used to enter the abdomen under direct visualization.  Pneumoperitoneum was established and atraumatic entry confirmed. Three additional 5291mports were placed on either side of the umbilicus and an 91m90mort was placed in the left upper quadrant under direct visualization. All port sites were injected with 10cc local anesthetic. The pelvis was bathed in a 60cc Ropivicaine  solution.  The patient was placed in Trendelenburg position and the da AT[employer]botic device was docked. Next, attention was turned to the console where the hysterectomy was performed.  The right fallopian tube was divided at the mesosalpinx. The uteroovarian anastamosis was divided and the right round ligament was divided.  This process was repeated on the contralateral side.  The anterior leaflet of the broad ligament was divided to create a bladder flap. The uterine artery and vein were skeletonized and desiccated superior to the Koh cup.  This process was repeated on the contralateral side.  Uterine blanching was observed.  A circumferential colpotomy was created along the ridge of the Koh cup and the uterus was passed off the field. There was difficulty removing the uterus through the vagina due to its large size -- there was an evulsion of the cervix from the uterus but the uterus was removed intact. The vaginal occluder was placed in the vagina to maintain pneumoperitoneum.  The vaginal cuff was then closed with V-loc suture. Hemostasis confirmed. Surgicel hemostatic powder was placed on the vaginal cuff and adnexa bilaterally.  The Da Vinci robotic device was undocked and all ports were visualized.   The pneumoperitoneum was reduced completely with the assistance of two deep breaths and all ports were removed.  The skin was closed with 4-0 Vicryl in subcuticular fashion with skin glue placed atop each port site.   The vagina was inspected and on the right side was a macerated area which required two figure-of-eight sutures using 3-0 Vicryl. Hemostasis achieved with pressure and Arista powder. No foreign objects were remaining within the vagina. The patient was returned to dorsal supine position, awakened  and extubated in the OR having appeared to tolerate the procedure well.  All sponge, needle, and instrument counts were correct x 2 at the end of the case.  Disposition:  PACU  Drema Dallas,  DO

## 2021-12-12 NOTE — Anesthesia Postprocedure Evaluation (Signed)
Anesthesia Post Note  Patient: Joan Schmidt  Procedure(s) Performed: XI ROBOTIC ASSISTED TOTAL HYSTERECTOMY WITH BILATERAL SALPINGECTOMIES (Abdomen)     Patient location during evaluation: PACU Anesthesia Type: General Level of consciousness: sedated Pain management: pain level controlled Vital Signs Assessment: post-procedure vital signs reviewed and stable Respiratory status: spontaneous breathing and respiratory function stable Cardiovascular status: stable Postop Assessment: no apparent nausea or vomiting Anesthetic complications: no   No notable events documented.  Last Vitals:  Vitals:   12/12/21 1645 12/12/21 1700  BP: (!) 160/90 (!) 162/93  Pulse:  87  Resp: (!) 34 (!) 33  Temp:  36.7 C  SpO2: 92% 95%    Last Pain:  Vitals:   12/12/21 1700  TempSrc:   PainSc: 5                  Yazmyne Sara DANIEL

## 2021-12-12 NOTE — Transfer of Care (Signed)
Immediate Anesthesia Transfer of Care Note  Patient: [name]  Procedure(s) Performed: XI ROBOTIC ASSISTED TOTAL HYSTERECTOMY WITH BILATERAL SALPINGECTOMIES (Abdomen)  Patient Location: PACU  Anesthesia Type:General  Level of Consciousness: awake, alert , oriented and patient cooperative  Airway & Oxygen Therapy: Patient Spontanous Breathing  Post-op Assessment: Report given to RN and Post -op Vital signs reviewed and stable  Post vital signs: Reviewed and stable  Last Vitals:  Vitals Value Taken Time  BP 154/103 12/12/21 1600  Temp 36.6 C 12/12/21 1556  Pulse 94 12/12/21 1602  Resp 19 12/12/21 1602  SpO2 100 % 12/12/21 1602  Vitals shown include unvalidated device data.  Last Pain:  Vitals:   12/12/21 1049  TempSrc: Oral  PainSc: 2       Patients Stated Pain Goal: 5 (21/03/12 8118)  Complications: No notable events documented.

## 2021-12-12 NOTE — Interval H&P Note (Signed)
History and Physical Interval Note:  12/12/2021 1:07 PM  Joan Schmidt  has presented today for surgery, with the diagnosis of N93.9 AUB.  The various methods of treatment have been discussed with the patient and family. After consideration of risks, benefits and other options for treatment, the patient has consented to  Procedure(s): XI ROBOTIC ASSISTED TOTAL HYSTERECTOMY WITH BILATERAL SALPINGO OOPHORECTOMY (N/A), possible mini laparotomy as a surgical intervention.  The patient's history has been reviewed, patient examined, no change in status, stable for surgery.  I have reviewed the patient's chart and labs.  Questions were answered to the patient's satisfaction.     Drema Dallas

## 2021-12-12 NOTE — Anesthesia Procedure Notes (Signed)
Procedure Name: Intubation Date/Time: 12/12/2021 1:25 PM  Performed by: Georgeanne Nim, CRNAPre-anesthesia Checklist: Patient identified, Emergency Drugs available, Suction available, Patient being monitored and Timeout performed Patient Re-evaluated:Patient Re-evaluated prior to induction Oxygen Delivery Method: Circle system utilized Preoxygenation: Pre-oxygenation with 100% oxygen Induction Type: IV induction Ventilation: Mask ventilation without difficulty Laryngoscope Size: Mac and 4 Grade View: Grade I Tube type: Oral Tube size: 7.0 mm Number of attempts: 1 Airway Equipment and Method: Stylet Placement Confirmation: ETT inserted through vocal cords under direct vision, positive ETCO2, CO2 detector and breath sounds checked- equal and bilateral Secured at: 20 cm Tube secured with: Tape Dental Injury: Teeth and Oropharynx as per pre-operative assessment

## 2021-12-12 NOTE — Anesthesia Preprocedure Evaluation (Signed)
Anesthesia Evaluation  Patient identified by MRN, date of birth, ID band Patient awake    Reviewed: Allergy & Precautions, H&P , NPO status , Patient's Chart, lab work & pertinent test results  Airway Mallampati: II   Neck ROM: full    Dental   Pulmonary neg pulmonary ROS,    breath sounds clear to auscultation       Cardiovascular hypertension,  Rhythm:regular Rate:Normal     Neuro/Psych PSYCHIATRIC DISORDERS Anxiety    GI/Hepatic   Endo/Other  diabetes, Type 2Morbid obesity  Renal/GU      Musculoskeletal   Abdominal   Peds  Hematology   Anesthesia Other Findings   Reproductive/Obstetrics                             Anesthesia Physical Anesthesia Plan  ASA: 2  Anesthesia Plan: General   Post-op Pain Management:    Induction: Intravenous  PONV Risk Score and Plan: 3 and Ondansetron, Dexamethasone, Midazolam and Treatment may vary due to age or medical condition  Airway Management Planned: Oral ETT  Additional Equipment:   Intra-op Plan:   Post-operative Plan: Extubation in OR  Informed Consent: I have reviewed the patients History and Physical, chart, labs and discussed the procedure including the risks, benefits and alternatives for the proposed anesthesia with the patient or authorized representative who has indicated his/her understanding and acceptance.     Dental advisory given  Plan Discussed with: CRNA, Anesthesiologist and Surgeon  Anesthesia Plan Comments:         Anesthesia Quick Evaluation

## 2021-12-13 ENCOUNTER — Encounter (HOSPITAL_BASED_OUTPATIENT_CLINIC_OR_DEPARTMENT_OTHER): Payer: Self-pay | Admitting: Obstetrics and Gynecology

## 2021-12-13 DIAGNOSIS — N939 Abnormal uterine and vaginal bleeding, unspecified: Secondary | ICD-10-CM | POA: Diagnosis not present

## 2021-12-13 LAB — GLUCOSE, CAPILLARY: Glucose-Capillary: 159 mg/dL — ABNORMAL HIGH (ref 70–99)

## 2021-12-13 MED ORDER — OXYCODONE HCL 5 MG PO TABS
ORAL_TABLET | ORAL | Status: AC
  Filled 2021-12-13: qty 1

## 2021-12-13 MED ORDER — OXYCODONE HCL 5 MG PO TABS: 5.0000 mg | ORAL_TABLET | Freq: Four times a day (QID) | ORAL | 0 refills | Status: AC | PRN

## 2021-12-13 MED ORDER — IBUPROFEN 600 MG PO TABS: 600.0000 mg | ORAL_TABLET | Freq: Four times a day (QID) | ORAL | 1 refills | Status: AC | PRN

## 2021-12-13 MED ORDER — IBUPROFEN 200 MG PO TABS
ORAL_TABLET | ORAL | Status: AC
  Filled 2021-12-13: qty 3

## 2021-12-13 MED ORDER — ACETAMINOPHEN 500 MG PO TABS
ORAL_TABLET | ORAL | Status: AC
  Filled 2021-12-13: qty 2

## 2021-12-13 NOTE — Progress Notes (Signed)
Pt taking her own insulin, she draws it up and administers to herself.  She took 10 Units at breakfast time.

## 2021-12-13 NOTE — Discharge Summary (Signed)
Physician Discharge Summary  Patient ID: Joan Schmidt MRN: [id number] DOB/AGE: January 11, 1981 41 y.o.  Admit date: 12/12/2021 Discharge date: 12/13/2021  Admission Diagnoses: Abnormal uterine bleeding Enlarged, fibroid uterus History of uterine artery embolization Failed medical management of AUB  Discharge Diagnoses:  Principal Problem:   Abnormal uterine bleeding (AUB) Enlarged, fibroid uterus History of uterine artery embolization Failed medical management of AUB  Procedure(s): Robotic Assisted Total Laparoscopic Hysterectomy with Bilateral Salpingectomy  Discharged Condition: good  Hospital Course: Patient was admitted on 12/12/2021 for the above named procedure(s) for the above named diagnoses. Prior to hospital discharge, patient was tolerating PO, ambulating, voiding spontaneously, passing flatus, and pain was well-controlled. See hospital chart for specific details. Patient was discharged home in stable condition.  Consults: None  Significant Diagnostic Studies: None  Treatments: surgery: as documented above  Discharge Exam: Blood pressure (!) 158/88, pulse 80, temperature 99.1 F (37.3 C), resp. rate 18, height '5\' 8"'$  (1.727 m), weight 126.8 kg, last menstrual period 11/15/2021, SpO2 100 %. Gen:  NAD, pleasant and cooperative Resp: Normal work of breathing Abd:  Soft, non-distended, non-tender throughout, no rebound/guarding, laparoscopic port sites c/d/I with skin glue atop Ext:  No bilateral LE edema, no bilateral calf tenderness  Disposition:   Discharge Instructions     Call MD for:  difficulty breathing, headache or visual disturbances   Complete by: As directed    Call MD for:  extreme fatigue   Complete by: As directed    Call MD for:  hives   Complete by: As directed    Call MD for:  persistant dizziness or light-headedness   Complete by: As directed    Call MD for:  persistant nausea and vomiting   Complete by: As directed    Call MD for:  redness,  tenderness, or signs of infection (pain, swelling, redness, odor or green/yellow discharge around incision site)   Complete by: As directed    Call MD for:  severe uncontrolled pain   Complete by: As directed    Call MD for:  temperature >100.4   Complete by: As directed    Diet - low sodium heart healthy   Complete by: As directed    Driving Restrictions   Complete by: As directed    No driving while taking narcotic medications and for at least 1.5-2 weeks.   Increase activity slowly   Complete by: As directed    Lifting restrictions   Complete by: As directed    No lifting greater than 10-15lbs.   Sexual Activity Restrictions   Complete by: As directed    No sexual intercourse or objects in the vagina for at least 6 weeks.      Allergies as of 12/13/2021       Reactions   Doxycycline    Felt weird and throat itchy.   Milk (cow)    Dairy products cause GI upset.        Medication List     STOP taking these medications    medroxyPROGESTERone 150 MG/ML injection Commonly known as: DEPO-PROVERA   tranexamic acid 650 MG Tabs tablet Commonly known as: LYSTEDA       TAKE these medications    docusate sodium 100 MG capsule Commonly known as: Colace Take 1 tablet once or twice daily as needed for constipation while taking narcotic pain medicine   HUMULIN R 500 UNIT/ML injection Generic drug: insulin regular human CONCENTRATED Inject 20 Units into the skin 2 (two) times daily with a meal.  ibuprofen 600 MG tablet Commonly known as: ADVIL Take 1 tablet (600 mg total) by mouth every 6 (six) hours as needed for mild pain or moderate pain.   losartan-hydrochlorothiazide 100-25 MG tablet Commonly known as: HYZAAR Take 1 tablet by mouth daily.   oxyCODONE 5 MG immediate release tablet Commonly known as: Oxy IR/ROXICODONE Take 1-2 tablets (5-10 mg total) by mouth every 6 (six) hours as needed for severe pain or breakthrough pain.   Ozempic (1 MG/DOSE) 4 MG/3ML  Sopn Generic drug: Semaglutide (1 MG/DOSE)        Follow-up Information     Drema Dallas, DO Follow up in 2 week(s).   Specialty: Obstetrics and Gynecology Why: Please keep your previously schedule post-operative visits. Contact information: [address] [place] [address] [phone]                 Signed: Drema Dallas 12/13/2021, 6:31 AM

## 2021-12-14 LAB — GLUCOSE, CAPILLARY: Glucose-Capillary: 330 mg/dL — ABNORMAL HIGH (ref 70–99)

## 2021-12-14 LAB — SURGICAL PATHOLOGY

## 2022-07-25 ENCOUNTER — Ambulatory Visit (INDEPENDENT_AMBULATORY_CARE_PROVIDER_SITE_OTHER): Payer: Medicaid Other

## 2022-07-25 ENCOUNTER — Ambulatory Visit
Admission: EM | Admit: 2022-07-25 | Discharge: 2022-07-25 | Disposition: A | Discharge status: Home / Self Care | Payer: Medicaid Other | Attending: Emergency Medicine | Admitting: Emergency Medicine

## 2022-07-25 DIAGNOSIS — J209 Acute bronchitis, unspecified: Secondary | ICD-10-CM

## 2022-07-25 DIAGNOSIS — M79641 Pain in right hand: Secondary | ICD-10-CM

## 2022-07-25 DIAGNOSIS — R051 Acute cough: Secondary | ICD-10-CM | POA: Insufficient documentation

## 2022-07-25 DIAGNOSIS — R0602 Shortness of breath: Secondary | ICD-10-CM

## 2022-07-25 DIAGNOSIS — I10 Essential (primary) hypertension: Secondary | ICD-10-CM

## 2022-07-25 DIAGNOSIS — Z1152 Encounter for screening for COVID-19: Secondary | ICD-10-CM | POA: Insufficient documentation

## 2022-07-25 MED ORDER — AZITHROMYCIN 250 MG PO TABS: 250.0000 mg | ORAL_TABLET | Freq: Every day | ORAL | 0 refills | Status: AC

## 2022-07-25 NOTE — ED Triage Notes (Signed)
Patient to Urgent Care with complaints of nasal congestion/ productive cough w/ discolored mucus/ SHOB w/ exertion. Denies any known fevers.   Denies any history of asthma but reports she has been prescribed an inhaler previously and this helped. Symptoms started a week ago. Taking mucinex/ cough medication.   Also reports right sided thumb pain. States she woke up with pain- believes she may have slept on it. Some swelling on thumb. Pain while moving. Denies any known injury.

## 2022-07-25 NOTE — ED Provider Notes (Signed)
Renaldo Fiddler    CSN: [id number] Arrival date & time: 07/25/22  1849      History   Chief Complaint Chief Complaint  Patient presents with   Nasal Congestion    HPI Joan Schmidt is a 42 y.o. female.  Patient presents with congestion, sore throat, cough, shortness of breath x 1 week.  Treatment attempted with Mucinex and albuterol inhaler.  No fever, ear pain, chest pain. Patient requests a COVID test and chest xray; reports remote history of pneumonia with COVID years ago.  Patient also presents with right thumb pain and swelling x 4 days.  No injury.  She has bruising at the base of her thumb.  The pain is worse with movement and palpation.  No open wounds, numbness, weakness, or other symptoms.  She requests an xray.  Her medical history includes hypertension and diabetes.   The history is provided by the patient and medical records.    Past Medical History:  Diagnosis Date   Anemia 11/28/2021   due to heavy bleeding from fibroids   Anxiety    has not seen a doctor   COVID-19    03/28/2019 possible pneumonia, ED visit prescribed antibiotics, also had Covid on  09/03/2019, 09/05/2020   Diabetes mellitus without complication (HCC)    Type 2, follow with Dr. Jerolyn Center, LOV w/in the last 6 months per pt on 11/26/21.   Fibroids    uterine   Hypertension    Wears glasses     Patient Active Problem List   Diagnosis Date Noted   Abnormal uterine bleeding (AUB) 12/02/2021   Essential hypertension 11/23/2015   Fibroids 11/23/2015   Uncontrolled type 2 diabetes mellitus with hyperglycemia (HCC) 12/27/2014   Type II or unspecified type diabetes mellitus without mention of complication, not stated as uncontrolled 08/25/2012    Past Surgical History:  Procedure Laterality Date   CHOLECYSTECTOMY  2015   ROBOTIC ASSISTED LAPAROSCOPIC HYSTERECTOMY AND SALPINGECTOMY N/A 12/12/2021   Procedure: XI ROBOTIC ASSISTED TOTAL HYSTERECTOMY WITH BILATERAL SALPINGECTOMIES;  Surgeon:  Steva Ready, DO;  Location: [facility];  Service: Gynecology;  Laterality: N/A;   UTERINE ARTERY EMBOLIZATION  2020    OB History   No obstetric history on file.      Home Medications    Prior to Admission medications   Medication Sig Start Date End Date Taking? Authorizing Provider  azithromycin (ZITHROMAX) 250 MG tablet Take 1 tablet (250 mg total) by mouth daily. Take first 2 tablets together, then 1 every day until finished. 07/25/22  Yes Mickie Bail, NP  docusate sodium (COLACE) 100 MG capsule Take 1 tablet once or twice daily as needed for constipation while taking narcotic pain medicine Patient not taking: Reported on 07/25/2022 11/17/15   Loleta Rose, MD  HUMULIN R 500 UNIT/ML injection Inject 20 Units into the skin 2 (two) times daily with a meal. 05/26/20   [provider]  ibuprofen (ADVIL) 600 MG tablet Take 1 tablet (600 mg total) by mouth every 6 (six) hours as needed for mild pain or moderate pain. Patient not taking: Reported on 07/25/2022 12/13/21   Steva Ready, DO  losartan-hydrochlorothiazide (HYZAAR) 100-25 MG tablet Take 1 tablet by mouth daily. 10/15/19 12/12/21  [provider]  oxyCODONE (OXY IR/ROXICODONE) 5 MG immediate release tablet Take 1-2 tablets (5-10 mg total) by mouth every 6 (six) hours as needed for severe pain or breakthrough pain. Patient not taking: Reported on 07/25/2022 12/13/21   Steva Ready,  DO  OZEMPIC, 1 MG/DOSE, 4 MG/3ML SOPN  06/14/20   [provider]  Exenatide ER 2 MG PEN Inject into the skin. 09/05/17 06/19/20  [provider]  metFORMIN (GLUCOPHAGE) 500 MG tablet Take 500 mg by mouth 2 (two) times daily with a meal.  06/19/20  [provider]    Family History Family History  Problem Relation Age of Onset   Diabetes Father     Social History Social History   Tobacco Use   Smoking status: Never   Smokeless tobacco: Never  Vaping Use   Vaping Use: Never used   Substance Use Topics   Alcohol use: No   Drug use: No     Allergies   Doxycycline and Milk (cow)   Review of Systems Review of Systems  Constitutional:  Negative for chills and fever.  HENT:  Positive for congestion and sore throat. Negative for ear pain.   Respiratory:  Positive for cough and shortness of breath. Negative for wheezing.   Cardiovascular:  Negative for chest pain and palpitations.  Gastrointestinal:  Negative for abdominal pain, diarrhea and vomiting.  Musculoskeletal:  Positive for arthralgias and joint swelling.  Skin:  Positive for color change. Negative for rash and wound.  Neurological:  Negative for weakness and numbness.  All other systems reviewed and are negative.    Physical Exam Triage Vital Signs ED Triage Vitals  Enc Vitals Group     BP      Pulse      Resp      Temp      Temp src      SpO2      Weight      Height      Head Circumference      Peak Flow      Pain Score      Pain Loc      Pain Edu?      Excl. in GC?    No data found.  Updated Vital Signs BP (!) 147/90   Pulse (!) 113   Temp 98.3 F (36.8 C)   Resp 18   LMP 11/15/2021 (Approximate) Comment: irregular periods, bleeding continuously for 13 days as of 11/28/21 per pt  SpO2 97%   Visual Acuity Right Eye Distance:   Left Eye Distance:   Bilateral Distance:    Right Eye Near:   Left Eye Near:    Bilateral Near:     Physical Exam Vitals and nursing note reviewed.  Constitutional:      General: She is not in acute distress.    Appearance: She is well-developed. She is obese. She is not ill-appearing.  HENT:     Right Ear: Tympanic membrane normal.     Left Ear: Tympanic membrane normal.     Nose: Congestion and rhinorrhea present.     Mouth/Throat:     Mouth: Mucous membranes are moist.     Pharynx: Oropharynx is clear.  Eyes:     Conjunctiva/sclera: Conjunctivae normal.  Cardiovascular:     Rate and Rhythm: Normal rate and regular rhythm.     Heart  sounds: Normal heart sounds.  Pulmonary:     Effort: Pulmonary effort is normal. No respiratory distress.     Breath sounds: Normal breath sounds. No wheezing, rhonchi or rales.  Musculoskeletal:        General: Swelling and tenderness present. No deformity.     Cervical back: Neck supple.     Comments: Right hand tender  and edematous from thumb to palm.  Ecchymosis at base of thumb.  No open wounds or erythema.  ROM of thumb limited by discomfort; FROM in fingers and wrist.  2+ radial pulse, brisk capillary refill, sensation intact.    Skin:    General: Skin is warm and dry.     Capillary Refill: Capillary refill takes less than 2 seconds.     Findings: Bruising present. No erythema or lesion.  Neurological:     General: No focal deficit present.     Mental Status: She is alert and oriented to person, place, and time.     Sensory: No sensory deficit.     Motor: No weakness.  Psychiatric:        Mood and Affect: Mood normal.        Behavior: Behavior normal.      UC Treatments / Results  Labs (all labs ordered are listed, but only abnormal results are displayed) Labs Reviewed  SARS CORONAVIRUS 2 (TAT 6-24 HRS)    EKG   Radiology DG Chest 2 View  Result Date: 07/25/2022 CLINICAL DATA:  Productive cough EXAM: CHEST - 2 VIEW COMPARISON:  None Available. FINDINGS: There is perihilar and lower lung zone interstitial thickening likely reflecting changes of airway inflammation. No confluent pulmonary infiltrate. No pneumothorax or pleural effusion. Cardiac size within normal limits. Pulmonary vascularity is normal. No acute bone abnormality. IMPRESSION: 1. Bronchitic change.  No confluent pulmonary infiltrate. Electronically Signed   By: Helyn Numbers M.D.   On: 07/25/2022 19:48   DG Hand Complete Right  Result Date: 07/25/2022 CLINICAL DATA:  Right thumb pain. EXAM: RIGHT HAND - COMPLETE 3+ VIEW COMPARISON:  None Available. FINDINGS: There is no evidence of fracture or  dislocation. There is no evidence of arthropathy or other focal bone abnormality. Soft tissues are unremarkable. IMPRESSION: Negative. Electronically Signed   By: Aram Candela M.D.   On: 07/25/2022 19:43    Procedures Procedures (including critical care time)  Medications Ordered in UC Medications - No data to display  Initial Impression / Assessment and Plan / UC Course  I have reviewed the triage vital signs and the nursing notes.  Pertinent labs & imaging results that were available during my care of the patient were reviewed by me and considered in my medical decision making (see chart for details).    Cough, shortness of breath.  Right hand pain.  Elevated blood pressure with HTN.  CXR shows bronchitic change, no infiltrate. Treating with Zithromax and continued use of albuterol inhaler.  COVID pending.  If COVID positive, recommend treatment with Paxlovid. Last GFR 95 on 05/16/2022.  Instructed patient to follow-up with her PCP.  Education provided on acute bronchitis and shortness of breath.   Xray of hand negative.  Treating with rest, elevation, Tylenol or ibuprofen, ice packs.  Instructed her to follow-up with orthopedics if her symptoms or not improving.  Contact information for on-call Ortho provided.    Also discussed with patient that her blood pressure is elevated today and needs to be rechecked by PCP in 2 to 4 weeks.  Education provided on managing hypertension.   Patient agrees to plan of care.     Final Clinical Impressions(s) / UC Diagnoses   Final diagnoses:  Acute cough  Shortness of breath  Pain of right hand  Elevated blood pressure reading in office with diagnosis of hypertension  Acute bronchitis, unspecified organism     Discharge Instructions      Take  Zithromax and continue to use your albuterol inhaler as directed.  Follow up with your primary care provider.    Take Tylenol as needed for discomfort.  Rest and elevate your hand.  Follow-up with an  orthopedist to you are not improving.  Your blood pressure is elevated today at 159/96; repeat 147/90.  Please have this rechecked by your primary care provider in 2-4 weeks.          ED Prescriptions     Medication Sig Dispense Auth. Provider   azithromycin (ZITHROMAX) 250 MG tablet Take 1 tablet (250 mg total) by mouth daily. Take first 2 tablets together, then 1 every day until finished. 6 tablet Mickie Bail, NP      PDMP not reviewed this encounter.   Mickie Bail, NP 07/25/22 2001

## 2022-07-25 NOTE — Discharge Instructions (Addendum)
Take Zithromax and continue to use your albuterol inhaler as directed.  Follow up with your primary care provider.    Take Tylenol as needed for discomfort.  Rest and elevate your hand.  Follow-up with an orthopedist to you are not improving.  Your blood pressure is elevated today at 159/96; repeat 147/90.  Please have this rechecked by your primary care provider in 2-4 weeks.

## 2022-07-26 LAB — SARS CORONAVIRUS 2 (TAT 6-24 HRS): SARS Coronavirus 2: NEGATIVE
